# Patient Record
Sex: Male | Born: 1945 | Race: White | Hispanic: No | Smoking: Former smoker
Health system: Southern US, Community
[De-identification: ages and names within clinical notes are randomized; demographics above are authoritative.]

## PROBLEM LIST (undated history)

## (undated) DIAGNOSIS — I5043 Acute on chronic combined systolic (congestive) and diastolic (congestive) heart failure: Secondary | ICD-10-CM

## (undated) DIAGNOSIS — M1990 Unspecified osteoarthritis, unspecified site: Secondary | ICD-10-CM

## (undated) DIAGNOSIS — I10 Essential (primary) hypertension: Secondary | ICD-10-CM

## (undated) DIAGNOSIS — I4891 Unspecified atrial fibrillation: Secondary | ICD-10-CM

---
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
## 2019-04-07 DIAGNOSIS — I509 Heart failure, unspecified: Secondary | ICD-10-CM

## 2019-04-07 DIAGNOSIS — J811 Chronic pulmonary edema: Secondary | ICD-10-CM

## 2019-04-07 DIAGNOSIS — I4891 Unspecified atrial fibrillation: Secondary | ICD-10-CM

## 2019-04-07 DIAGNOSIS — I361 Nonrheumatic tricuspid (valve) insufficiency: Secondary | ICD-10-CM

## 2019-04-07 DIAGNOSIS — J90 Pleural effusion, not elsewhere classified: Secondary | ICD-10-CM

## 2019-04-07 DIAGNOSIS — I10 Essential (primary) hypertension: Secondary | ICD-10-CM

## 2019-04-07 DIAGNOSIS — I340 Nonrheumatic mitral (valve) insufficiency: Secondary | ICD-10-CM

## 2019-04-08 DIAGNOSIS — I4891 Unspecified atrial fibrillation: Secondary | ICD-10-CM | POA: Diagnosis not present

## 2019-04-08 DIAGNOSIS — J811 Chronic pulmonary edema: Secondary | ICD-10-CM | POA: Diagnosis not present

## 2019-04-08 DIAGNOSIS — I509 Heart failure, unspecified: Secondary | ICD-10-CM | POA: Diagnosis not present

## 2019-04-08 DIAGNOSIS — I10 Essential (primary) hypertension: Secondary | ICD-10-CM | POA: Diagnosis not present

## 2019-04-08 DIAGNOSIS — J90 Pleural effusion, not elsewhere classified: Secondary | ICD-10-CM | POA: Diagnosis not present

## 2019-04-09 DIAGNOSIS — J811 Chronic pulmonary edema: Secondary | ICD-10-CM | POA: Diagnosis not present

## 2019-04-09 DIAGNOSIS — I10 Essential (primary) hypertension: Secondary | ICD-10-CM | POA: Diagnosis not present

## 2019-04-09 DIAGNOSIS — J90 Pleural effusion, not elsewhere classified: Secondary | ICD-10-CM | POA: Diagnosis not present

## 2019-04-09 DIAGNOSIS — I4891 Unspecified atrial fibrillation: Secondary | ICD-10-CM | POA: Diagnosis not present

## 2019-04-10 DIAGNOSIS — J811 Chronic pulmonary edema: Secondary | ICD-10-CM | POA: Diagnosis not present

## 2019-04-10 DIAGNOSIS — I4891 Unspecified atrial fibrillation: Secondary | ICD-10-CM | POA: Diagnosis not present

## 2019-04-10 DIAGNOSIS — J90 Pleural effusion, not elsewhere classified: Secondary | ICD-10-CM | POA: Diagnosis not present

## 2019-04-10 DIAGNOSIS — I10 Essential (primary) hypertension: Secondary | ICD-10-CM | POA: Diagnosis not present

## 2019-04-11 ENCOUNTER — Encounter (HOSPITAL_COMMUNITY): Payer: Self-pay

## 2019-04-11 ENCOUNTER — Inpatient Hospital Stay (HOSPITAL_COMMUNITY): Payer: Medicare Other

## 2019-04-11 ENCOUNTER — Inpatient Hospital Stay (HOSPITAL_COMMUNITY)
Admission: AD | Admit: 2019-04-11 | Discharge: 2019-04-14 | DRG: 286 | Disposition: A | Discharge status: Home / Self Care | Payer: Medicare Other | Source: Other Acute Inpatient Hospital | Attending: Internal Medicine | Admitting: Internal Medicine

## 2019-04-11 ENCOUNTER — Other Ambulatory Visit: Payer: Self-pay

## 2019-04-11 DIAGNOSIS — I5043 Acute on chronic combined systolic (congestive) and diastolic (congestive) heart failure: Secondary | ICD-10-CM

## 2019-04-11 DIAGNOSIS — R918 Other nonspecific abnormal finding of lung field: Secondary | ICD-10-CM

## 2019-04-11 DIAGNOSIS — R9439 Abnormal result of other cardiovascular function study: Secondary | ICD-10-CM | POA: Diagnosis not present

## 2019-04-11 DIAGNOSIS — R079 Chest pain, unspecified: Secondary | ICD-10-CM | POA: Diagnosis not present

## 2019-04-11 DIAGNOSIS — Z87891 Personal history of nicotine dependence: Secondary | ICD-10-CM | POA: Diagnosis not present

## 2019-04-11 DIAGNOSIS — M1990 Unspecified osteoarthritis, unspecified site: Secondary | ICD-10-CM | POA: Diagnosis present

## 2019-04-11 DIAGNOSIS — J90 Pleural effusion, not elsewhere classified: Secondary | ICD-10-CM | POA: Diagnosis not present

## 2019-04-11 DIAGNOSIS — R0600 Dyspnea, unspecified: Secondary | ICD-10-CM

## 2019-04-11 DIAGNOSIS — I10 Essential (primary) hypertension: Secondary | ICD-10-CM

## 2019-04-11 DIAGNOSIS — I4891 Unspecified atrial fibrillation: Secondary | ICD-10-CM | POA: Diagnosis present

## 2019-04-11 DIAGNOSIS — I429 Cardiomyopathy, unspecified: Secondary | ICD-10-CM | POA: Diagnosis present

## 2019-04-11 DIAGNOSIS — I2510 Atherosclerotic heart disease of native coronary artery without angina pectoris: Secondary | ICD-10-CM | POA: Diagnosis present

## 2019-04-11 DIAGNOSIS — R7989 Other specified abnormal findings of blood chemistry: Secondary | ICD-10-CM | POA: Diagnosis not present

## 2019-04-11 DIAGNOSIS — I110 Hypertensive heart disease with heart failure: Secondary | ICD-10-CM | POA: Diagnosis present

## 2019-04-11 DIAGNOSIS — I25118 Atherosclerotic heart disease of native coronary artery with other forms of angina pectoris: Secondary | ICD-10-CM | POA: Diagnosis not present

## 2019-04-11 DIAGNOSIS — R778 Other specified abnormalities of plasma proteins: Secondary | ICD-10-CM

## 2019-04-11 DIAGNOSIS — J811 Chronic pulmonary edema: Secondary | ICD-10-CM | POA: Diagnosis not present

## 2019-04-11 DIAGNOSIS — I340 Nonrheumatic mitral (valve) insufficiency: Secondary | ICD-10-CM | POA: Diagnosis not present

## 2019-04-11 HISTORY — DX: Acute on chronic combined systolic (congestive) and diastolic (congestive) heart failure: I50.43

## 2019-04-11 HISTORY — DX: Unspecified osteoarthritis, unspecified site: M19.90

## 2019-04-11 HISTORY — DX: Unspecified atrial fibrillation: I48.91

## 2019-04-11 HISTORY — DX: Essential (primary) hypertension: I10

## 2019-04-11 LAB — CBC WITH DIFFERENTIAL/PLATELET
Abs Immature Granulocytes: 0.02 10*3/uL (ref 0.00–0.07)
Basophils Absolute: 0.1 10*3/uL (ref 0.0–0.1)
Basophils Relative: 1 %
Eosinophils Absolute: 0.1 10*3/uL (ref 0.0–0.5)
Eosinophils Relative: 1 %
HCT: 38.4 % — ABNORMAL LOW (ref 39.0–52.0)
Hemoglobin: 13 g/dL (ref 13.0–17.0)
Immature Granulocytes: 0 %
Lymphocytes Relative: 22 %
Lymphs Abs: 1.4 10*3/uL (ref 0.7–4.0)
MCH: 29.5 pg (ref 26.0–34.0)
MCHC: 33.9 g/dL (ref 30.0–36.0)
MCV: 87.1 fL (ref 80.0–100.0)
Monocytes Absolute: 0.7 10*3/uL (ref 0.1–1.0)
Monocytes Relative: 11 %
Neutro Abs: 4 10*3/uL (ref 1.7–7.7)
Neutrophils Relative %: 65 %
Platelets: 175 10*3/uL (ref 150–400)
RBC: 4.41 MIL/uL (ref 4.22–5.81)
RDW: 13.3 % (ref 11.5–15.5)
WBC: 6.2 10*3/uL (ref 4.0–10.5)
nRBC: 0 % (ref 0.0–0.2)

## 2019-04-11 LAB — COMPREHENSIVE METABOLIC PANEL
ALT: 31 U/L (ref 0–44)
AST: 31 U/L (ref 15–41)
Albumin: 3.8 g/dL (ref 3.5–5.0)
Alkaline Phosphatase: 57 U/L (ref 38–126)
Anion gap: 9 (ref 5–15)
BUN: 27 mg/dL — ABNORMAL HIGH (ref 8–23)
CO2: 25 mmol/L (ref 22–32)
Calcium: 9.2 mg/dL (ref 8.9–10.3)
Chloride: 104 mmol/L (ref 98–111)
Creatinine, Ser: 1.08 mg/dL (ref 0.61–1.24)
GFR calc Af Amer: >60 mL/min (ref >60)
GFR calc non Af Amer: >60 mL/min (ref >60)
Glucose, Bld: 112 mg/dL — ABNORMAL HIGH (ref 70–99)
Potassium: 3.5 mmol/L (ref 3.5–5.1)
Sodium: 138 mmol/L (ref 135–145)
Total Bilirubin: 1.1 mg/dL (ref 0.3–1.2)
Total Protein: 6.9 g/dL (ref 6.5–8.1)

## 2019-04-11 LAB — T4, FREE: Free T4: 0.81 ng/dL (ref 0.61–1.12)

## 2019-04-11 LAB — TSH: TSH: 3.731 u[IU]/mL (ref 0.350–4.500)

## 2019-04-11 LAB — PROTIME-INR
INR: 1.2 (ref 0.8–1.2)
Prothrombin Time: 15.4 seconds — ABNORMAL HIGH (ref 11.4–15.2)

## 2019-04-11 LAB — MAGNESIUM: Magnesium: 2.5 mg/dL — ABNORMAL HIGH (ref 1.7–2.4)

## 2019-04-11 LAB — APTT: aPTT: 69 seconds — ABNORMAL HIGH (ref 24–36)

## 2019-04-11 LAB — HEPARIN LEVEL (UNFRACTIONATED): Heparin Unfractionated: 0.18 IU/mL — ABNORMAL LOW (ref 0.30–0.70)

## 2019-04-11 LAB — TROPONIN I (HIGH SENSITIVITY): Troponin I (High Sensitivity): 21 ng/L — ABNORMAL HIGH (ref <18)

## 2019-04-11 MED ORDER — ASPIRIN EC 81 MG PO TBEC
81.0000 mg | DELAYED_RELEASE_TABLET | Freq: Every day | ORAL | Status: DC
  Administered 2019-04-13 – 2019-04-14 (×2): 81 mg via ORAL
  Filled 2019-04-11 (×2): qty 1

## 2019-04-11 MED ORDER — AMIODARONE HCL IN DEXTROSE 360-4.14 MG/200ML-% IV SOLN
30.0000 mg/h | INTRAVENOUS | Status: DC
  Administered 2019-04-12 – 2019-04-14 (×3): 30 mg/h via INTRAVENOUS
  Filled 2019-04-11 (×7): qty 200

## 2019-04-11 MED ORDER — SODIUM CHLORIDE 0.9% FLUSH
3.0000 mL | Freq: Two times a day (BID) | INTRAVENOUS | Status: DC
  Administered 2019-04-14: 3 mL via INTRAVENOUS

## 2019-04-11 MED ORDER — ONDANSETRON HCL 4 MG/2ML IJ SOLN: 4.0000 mg | Freq: Four times a day (QID) | INTRAMUSCULAR | Status: DC | PRN

## 2019-04-11 MED ORDER — METOPROLOL TARTRATE 5 MG/5ML IV SOLN
INTRAVENOUS | Status: AC
  Administered 2019-04-11: 5 mg via INTRAVENOUS
  Filled 2019-04-11: qty 5

## 2019-04-11 MED ORDER — ASPIRIN 81 MG PO CHEW
81.0000 mg | CHEWABLE_TABLET | ORAL | Status: AC
  Administered 2019-04-12: 81 mg via ORAL
  Filled 2019-04-11: qty 1

## 2019-04-11 MED ORDER — HEPARIN (PORCINE) 25000 UT/250ML-% IV SOLN
1800.0000 [IU]/h | INTRAVENOUS | Status: DC
  Administered 2019-04-11: 1550 [IU]/h via INTRAVENOUS
  Administered 2019-04-12: 1800 [IU]/h via INTRAVENOUS
  Filled 2019-04-11 (×2): qty 250

## 2019-04-11 MED ORDER — FUROSEMIDE 10 MG/ML IJ SOLN: 40.0000 mg | Freq: Two times a day (BID) | INTRAMUSCULAR | Status: DC

## 2019-04-11 MED ORDER — SPIRONOLACTONE 12.5 MG HALF TABLET
12.5000 mg | ORAL_TABLET | Freq: Every day | ORAL | Status: DC
  Administered 2019-04-12 – 2019-04-14 (×3): 12.5 mg via ORAL
  Filled 2019-04-11 (×3): qty 1

## 2019-04-11 MED ORDER — AMIODARONE HCL IN DEXTROSE 360-4.14 MG/200ML-% IV SOLN
60.0000 mg/h | INTRAVENOUS | Status: AC
  Filled 2019-04-11: qty 200

## 2019-04-11 MED ORDER — ATORVASTATIN CALCIUM 80 MG PO TABS
80.0000 mg | ORAL_TABLET | Freq: Every day | ORAL | Status: DC
  Administered 2019-04-11 – 2019-04-13 (×3): 80 mg via ORAL
  Filled 2019-04-11 (×3): qty 1

## 2019-04-11 MED ORDER — METOPROLOL TARTRATE 100 MG PO TABS
100.0000 mg | ORAL_TABLET | Freq: Two times a day (BID) | ORAL | Status: DC
  Administered 2019-04-11 – 2019-04-14 (×6): 100 mg via ORAL
  Filled 2019-04-11 (×6): qty 1

## 2019-04-11 MED ORDER — METOPROLOL TARTRATE 5 MG/5ML IV SOLN
5.0000 mg | Freq: Once | INTRAVENOUS | Status: AC
  Administered 2019-04-11: 17:00:00 5 mg via INTRAVENOUS

## 2019-04-11 MED ORDER — SODIUM CHLORIDE 0.9% FLUSH: 3.0000 mL | INTRAVENOUS | Status: DC | PRN

## 2019-04-11 MED ORDER — SODIUM CHLORIDE 0.9 % IV SOLN
INTRAVENOUS | Status: DC
  Administered 2019-04-12: 10 mL/h via INTRAVENOUS

## 2019-04-11 MED ORDER — ACETAMINOPHEN 325 MG PO TABS: 650.0000 mg | ORAL_TABLET | ORAL | Status: DC | PRN

## 2019-04-11 MED ORDER — NITROGLYCERIN 0.4 MG SL SUBL: 0.4000 mg | SUBLINGUAL_TABLET | SUBLINGUAL | Status: DC | PRN

## 2019-04-11 MED ORDER — FUROSEMIDE 10 MG/ML IJ SOLN
40.0000 mg | Freq: Once | INTRAMUSCULAR | Status: AC
  Administered 2019-04-11: 40 mg via INTRAVENOUS
  Filled 2019-04-11: qty 4

## 2019-04-11 MED ORDER — AMIODARONE LOAD VIA INFUSION
150.0000 mg | Freq: Once | INTRAVENOUS | Status: AC
  Administered 2019-04-11: 150 mg via INTRAVENOUS
  Filled 2019-04-11: qty 83.34

## 2019-04-11 MED ORDER — SODIUM CHLORIDE 0.9 % IV SOLN: 250.0000 mL | INTRAVENOUS | Status: DC | PRN

## 2019-04-11 MED ORDER — LOSARTAN POTASSIUM 25 MG PO TABS
25.0000 mg | ORAL_TABLET | Freq: Once | ORAL | Status: AC
  Administered 2019-04-11: 25 mg via ORAL
  Filled 2019-04-11: qty 1

## 2019-04-11 MED ORDER — LOSARTAN POTASSIUM 25 MG PO TABS
25.0000 mg | ORAL_TABLET | Freq: Every day | ORAL | Status: DC
  Administered 2019-04-12 – 2019-04-14 (×3): 25 mg via ORAL
  Filled 2019-04-11 (×3): qty 1

## 2019-04-11 NOTE — Progress Notes (Signed)
Pt arrived to 3e 29 via ambulance , pt alert and oriented, heparin at 15.73ml per report and pt was received by Mikle Bosworth RN, pt afib on telemetry and confirmed with CCMD, paged MD to advise pt arrived to floor, pt oriented to unit, bed and call bell

## 2019-04-11 NOTE — Progress Notes (Signed)
Pt hr remaining high, D Dunn PA in to see pt, order for lopressorivx1, pt says he feels fine.

## 2019-04-11 NOTE — Consult Note (Addendum)
Cardiology Consultation:   Patient ID: Dwayne Mendoza; [id number]; [birth year]   Admit date: 04/11/2019 Date of Consult: 04/11/2019  Primary Care Provider: Patient, No Pcp Per Primary Cardiologist: No primary care provider on file. = will need f/u in [facility] Primary Electrophysiologist:  None  Chief Complaint: shortness of breath  Patient Profile:   Dwayne Mendoza is a 73 y.o. male with no significant PMH except former tobacco abuse (15 yrs) who is being seen today for the evaluation of atrial fibrillation, new onset systolic CHF and abnormal stress test at the request of Dr. Tanda Rockers at [facility].  History of Present Illness:   Dwayne Mendoza considers himself fortunate to have had good health all these years and does not traditionally seek medical care. He enjoys [employer] and is on-the-go sun up to sun down. He cuts wood and does yardwork. Last week on Thursday 5/25 he noted worsening SOB. Looking back over the week before he now retrospectively acknowledges generalized weight gain within his clothes, swelling, and an unsettled feeling in his chest about 1-2 days prior. He has not had any recent chest pain or viral prodromes. He presented to [facility] where he was found to be hypertensive and in atrial fib with RVR of uncertain duration. Admit note indicates HR was in the 120s and BP 188/95. He was started on IV heparin, Cardizem drip and IV Lasix. Cardizem was discontinued in lieu of switching to metoprolol and adding lisinopril and spironolactone. His troponin level was mildly elevated at 0.04-0.04-0.05-0.04. CXR 04/07/19 showed possible RLL opacity concerning for atelectasis or infiltrate. CT angio 04/07/19 showed patchy ground glass densities in both lungs, R>L, potentially related to pulmonary edema or multilobar PNA; moderate bilateral pleural effusions, mild reactive mediastinal and hilar lymphadenopathy, no evidence of PE. 2D echo 04/07/19 showed mild LVH, severe  global HK EF 25-30%, severely dilated LA, mildly enlarged RA, mild MR/TR. With diuresis he was able to be titrated off supplemental O2 and has had significant improvement in symptoms. He underwent Lexiscan nuclear stress test today with resting HR 100 and peak HR 155 with 69mm ST depression in the inferolateral leads (although EKG changes with Lexiscan are often nonspecific). Imaging results showed areas of prior infarct in the mid septal wall as well as inferior wall near the base, small area of reversibility is noted in the lateral wall consistent with ischemia, EF 21%, high risk. It was recommended he transfer to [facility] for consideration of cardiac cath. He is feeling much better but still has slight lower extremity edema surrounding his ankles.  Initial labs showed A1C 6.4, pro BNP 1340, albumin 4.4, TBili 1.4, AST/ALT OK, TSH 6.94, free T4 normal, trig 83, LDL 96. LAt BMET 04/11/19 showed Na 138, K 3.7, BUN 29, Cr 0.90, Mg 2.3, Hgb 13.0, Hct 38.1, Covid negative - WBC normal upon arrival.   Past Medical History:  Diagnosis Date   Arthritis     History reviewed. No pertinent surgical history.   Inpatient Medications: Scheduled Meds:  [START ON 04/12/2019] aspirin EC  81 mg Oral Daily   furosemide  40 mg Intravenous Once   [START ON 04/12/2019] losartan  25 mg Oral Daily   metoprolol tartrate  100 mg Oral BID   [START ON 04/12/2019] spironolactone  12.5 mg Oral Daily   Continuous Infusions:  heparin     PRN Meds: acetaminophen, nitroGLYCERIN, ondansetron (ZOFRAN) IV  Home Meds: Prior to Admission medications   Medication Sig Start Date End Date Taking?  Authorizing Provider  ibuprofen (ADVIL) 200 MG tablet Take 200 mg by mouth every 8 (eight) hours as needed for headache or moderate pain.   Yes [provider]    Allergies:   No Known Allergies  Social History:   Social History   Socioeconomic History   Marital status: Widowed    Spouse name: Not on  file   Number of children: Not on file   Years of education: Not on file   Highest education level: Not on file  Occupational History   Not on file  Social Needs   Financial resource strain: Not on file   Food insecurity    Worry: Not on file    Inability: Not on file   Transportation needs    Medical: Not on file    Non-medical: Not on file  Tobacco Use   Smoking status: Former Smoker    Types: Cigarettes    Quit date: 04/11/1979    Years since quitting: 40.0   Smokeless tobacco: Never Used  Substance and Sexual Activity   Alcohol use: Never    Frequency: Never   Drug use: Never   Sexual activity: Not on file  Lifestyle   Physical activity    Days per week: Not on file    Minutes per session: Not on file   Stress: Not on file  Relationships   Social connections    Talks on phone: Not on file    Gets together: Not on file    Attends religious service: Not on file    Active member of club or organization: Not on file    Attends meetings of clubs or organizations: Not on file    Relationship status: Not on file   Intimate partner violence    Fear of current or ex partner: Not on file    Emotionally abused: Not on file    Physically abused: Not on file    Forced sexual activity: Not on file  Other Topics Concern   Not on file  Social History Narrative   Not on file    Family History:   The patient's family history includes Other in his father and mother.  Father died age 78 unclear cause but occupational exposures? Mother died in her 46s, possibly heart  ROS:  Please see the history of present illness.  All other ROS reviewed and negative.     Physical Exam/Data:   Vitals:   04/11/19 1444 04/11/19 1637 04/11/19 1649  BP: 115/87 (!) 135/99 (!) 140/98  Pulse: (!) 106    Resp: 20    Temp: 98.4 F (36.9 C)    TempSrc: Oral    SpO2: 99%    Weight: 111 kg    Height: 5\' 10"  (1.778 m)     No intake or output data in the 24 hours ending  04/11/19 1717 Last 3 Weights 04/11/2019  Weight (lbs) 244 lb 11.2 oz  Weight (kg) 110.995 kg    Body mass index is 35.11 kg/m.  General: Well developed, well nourished cheerful WM in no acute distress. Head: Normocephalic, atraumatic, sclera non-icteric, no xanthomas, nares are without discharge.  Neck: Negative for carotid bruits. JVD not elevated. Lungs: Clear bilaterally to auscultation without wheezes, rales, or rhonchi. Breathing is unlabored. Heart: Irregularly irregular, tachycardic, with S1 S2. No murmurs, rubs, or gallops appreciated. Abdomen: Soft, non-tender, non-distended with normoactive bowel sounds. No hepatomegaly. No rebound/guarding. No obvious abdominal masses. Msk:  Strength and tone appear normal for age. Extremities:  No clubbing or cyanosis. Trace BL ankle edema (stocky).  Distal pedal pulses are 2+ and equal bilaterally. Neuro: Alert and oriented X 3. No facial asymmetry. No focal deficit. Moves all extremities spontaneously. Psych:  Responds to questions appropriately with a normal affect.  EKG:  The EKG was personally reviewed and demonstrates atrial fib 136bpm, possible LVH, nonspecific STT changes diffusely. No prior to compare to.   Laboratory Data: Referenced above  Radiology/Studies:  No results found.  Assessment and Plan:   1. Atrial fib with RVR of unknown duration - continue heparin per pharmacy. 5mg  IV Lopressor given on floor due to HR 150s. Increase oral metoprolol to 100mg  BID. Pending results of cath, will need to think about pursuing DCCV (+/- TEE during admission or after 3 weeks of uninterrupted anticoagulation). Dr. Delton SeeNelson recommends to start IV amiodarone given severe LA enlargement and LV dysfunction as there is concern patient have difficulty maintaining NSR even if cardioverted. Diltiazem is being avoided given his LV dysfunction.  2. Abnormal stress test with acute systolic CHF/cardiomyopathy of undetermined etiology - stress test raises  question of ischemia so will plan R/LHC to definitively eval in AM. Risks and benefits of cardiac catheterization have been discussed with the patient. These include bleeding, infection, kidney damage, stroke, heart attack, death. The patient understands these risks and is willing to proceed. He's on for 10:30 tomorrow with Dr. Allyson SabalBerry per preliminary discussion with cath lab. Increase metoprolol as above with plans to eventually consolidate to Toprol. Continue IV Lasix through one more dose this PM then hold further doses. Continue spironolactone. Change lisinopril to losartan in case we decide to start Entresto this admission following cath. Add statin if CAD is present. Given AF RVR there is a chance that cardiomyopathy could be tachy-mediated instead. Denies ETOH/drug use.  3. Essential HTN - follow BP with above medication regimen.  4. Abnormal TSH - will need OP f/u for this. IM has ordered repeat this admission.   For questions or updates, please contact [facility] Please consult [email or web address] for contact info under Cardiology/STEMI.   Signed, Laurann Montanaayna N Dunn, PA-C  04/11/2019 5:17 PM   The patient was seen, examined and discussed with Ronie Spiesayna Dunn, PA-C and I agree with the above.   Physical exam reveals no JVDs, S1-S2 regular rhythm, 3 out of 6 systolic murmur, minimal crackles bilaterally, mild nonpitting lower extremity edema bilaterally up to mid calves.  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  If he does not cardiovert spontaneously we will plan for TEE with cardioversion on Wednesday.  Increase metoprolol to 100 mg p.o. twice daily, continue aspirin, spironolactone, add atorvastatin  and losartan 25 mg daily.  Tobias AlexanderKatarina Anyelin Mogle, MD

## 2019-04-11 NOTE — Progress Notes (Signed)
ANTICOAGULATION CONSULT NOTE - Initial Consult  Pharmacy Consult for Heparin Indication: atrial fibrillation and abnormal stress test for cards eval  No Known Allergies  Patient Measurements: Height: 5\' 10"  (177.8 cm) Weight: 244 lb 11.2 oz (111 kg) IBW/kg (Calculated) : 73 Heparin Dosing Weight: 97.2 kg  Vital Signs: Temp: 98.4 F (36.9 C) (06/29 1444) Temp Source: Oral (06/29 1444) BP: 140/98 (06/29 1649) Pulse Rate: 106 (06/29 1444)  Labs: Recent Labs    04/11/19 1617  HGB 13.0  HCT 38.4*  PLT 175  APTT 69*  LABPROT 15.4*  INR 1.2    CrCl cannot be calculated (No successful lab value found.).   Medical History: Past Medical History:  Diagnosis Date  . Arthritis     Medications:  Infusions:  . heparin      Assessment: 34 YOM who presented to [facility] with 1-2 weeks of shortness of breath and found to be in Afib with RVR transferred to [facility] on 6/29 for cards evaluation after an abnormal stress test.. Patient has been on IV Heparin since 6/25 at [place] with a current rate of 1550 units/hr upon transfer. Pharmacy consulted to dose Heparin this admission.   aPTTs were being checked for heparin monitoring at [facility] with a last aPTT of 53 at 0030 at [facility] on a rate of 1550 units/hr (15.5 ml/hr)  A heparin level on admission here at [facility] is SUBtherapeutic (HL 0.18, goal of 0.3-0.7). Hgb 13, plts 175. No bleeding or issues with the drip noted - will increase and recheck a 8h HL.   Goal of Therapy:  Heparin level 0.3-0.7 units/ml Monitor platelets by anticoagulation protocol: Yes   Plan:   - Increase Heparin drip rate to 1800 units/hr (18 ml/hr) - Will continue to monitor for any signs/symptoms of bleeding and will follow up with heparin level in 8 hours   Thank you for allowing pharmacy to be a part of this patient's care.  Alycia Rossetti, PharmD, BCPS Clinical Pharmacist Clinical phone for 04/11/2019: [phone] 04/11/2019 5:14 PM    **Pharmacist phone directory can now be found on [email or web address] ([email or web address]).  Listed under [place].

## 2019-04-11 NOTE — H&P (Signed)
History and Physical:    Dwayne Mendoza   XVQ:[id number] DOB: 09/25/1946 DOA: 04/11/2019  Referring MD/provider: Dr [place], [facility]  PCP: Patient, No Pcp Per   Patient coming from: Home  Chief Complaint: Patient initially presented with shortness of breath found to have new onset A. fib with RVR, pulmonary edema with an STEMI and positive stress test.  Patient is now transferred for cardiac catheterization in the morning.  History of Present Illness:   Dwayne Mendoza is an 73 y.o. male with past medical history significant for tobacco use who was in his usual state of health until last week when he presented to [facility] with worsening dyspnea on exertion and some uncomfortable feelings in his chest.  In the ED patient was noted to be in new onset atrial fibrillation with RVR, newly hypertensive and to have groundglass opacifications in his lungs suggestive of pulmonary edema.  COVID test at that time was negative.  Patient was admitted and responded well to diuresis with significantly improved respiratory status.  He did not quite rule in for an NSTEMI however echocardiogram did show severe global hypokinesis with an EF of 25 to 30%, severely dilated left atrium and a mildly enlarged right atrium.  Patient underwent a Lexiscan nuclear stress test today which was positive for inferior wall ischemia and may be some septal ischemia as well.  Patient is now transferred to [facility] for catheterization in the morning.  Patient states he feels well today.  He feels very fortunate to have gotten the care that he got.  He realizes now that he is really been short of breath for several months but he did not know it.  He notes he feels much better after diuresis.  No chest pain at all.    ROS:   ROS   Review of Systems: General: No fever, chills, weight changes Endocrine: no heat/cold intolerance, no polyuria Respiratory: No cough,, shortness of breath, hemoptysis GI: No  nausea, vomiting, diarrhea, constipation GU: No dysuria, increased frequency CNS: No numbness, dizziness, headache Musculoskeletal: No back pain, joint pain Blood/lymphatics: No easy bruising, bleeding Mood/affect: No anxiety/depression    Past Medical History:   Past Medical History:  Diagnosis Date  . Arthritis     Past Surgical History:   History reviewed. No pertinent surgical history.  Social History:   Social History   Socioeconomic History  . Marital status: Widowed    Spouse name: Not on file  . Number of children: Not on file  . Years of education: Not on file  . Highest education level: Not on file  Occupational History  . Not on file  Social Needs  . Financial resource strain: Not on file  . Food insecurity    Worry: Not on file    Inability: Not on file  . Transportation needs    Medical: Not on file    Non-medical: Not on file  Tobacco Use  . Smoking status: Former Smoker    Types: Cigarettes    Quit date: 04/11/1979    Years since quitting: 40.0  . Smokeless tobacco: Never Used  Substance and Sexual Activity  . Alcohol use: Never    Frequency: Never  . Drug use: Never  . Sexual activity: Not on file  Lifestyle  . Physical activity    Days per week: Not on file    Minutes per session: Not on file  . Stress: Not on file  Relationships  . Social connections  Talks on phone: Not on file    Gets together: Not on file    Attends religious service: Not on file    Active member of club or organization: Not on file    Attends meetings of clubs or organizations: Not on file    Relationship status: Not on file  . Intimate partner violence    Fear of current or ex partner: Not on file    Emotionally abused: Not on file    Physically abused: Not on file    Forced sexual activity: Not on file  Other Topics Concern  . Not on file  Social History Narrative  . Not on file    Allergies   Patient has no known allergies.  Family history:    Family History  Problem Relation Age of Onset  . Other Father        Patient unsure what patient died of but had a lot of occupational exposures - died age 75  . Other Mother        Patient unsure, maybe heart - died in her 3690s    Current Medications:   Prior to Admission medications   Medication Sig Start Date End Date Taking? Authorizing Provider  ibuprofen (ADVIL) 200 MG tablet Take 400 mg by mouth every 8 (eight) hours as needed for headache (arthritis pain).    Yes [provider]    Physical Exam:   Vitals:   04/11/19 1444 04/11/19 1637 04/11/19 1649 04/11/19 1751  BP: 115/87 (!) 135/99 (!) 140/98 139/88  Pulse: (!) 106   100  Resp: 20     Temp: 98.4 F (36.9 C)   98.5 F (36.9 C)  TempSrc: Oral   Oral  SpO2: 99%     Weight: 111 kg     Height: 5\' 10"  (1.778 m)        Physical Exam: Blood pressure 139/88, pulse 100, temperature 98.5 F (36.9 C), temperature source Oral, resp. rate 20, height 5\' 10"  (1.778 m), weight 111 kg, SpO2 99 %. Gen: Friendly gentleman with centripetal obesity lying in bed at 30 degrees in no acute respiratory distress.  Speaking in full sentences without difficulty Eyes: Sclerae anicteric. Conjunctiva mildly injected. Chest: Moderately good air entry bilaterally with no adventitious sounds.  CV: Distant, irregular, systolic murmur without radiation. Abdomen: NABS, soft, nondistended, nontender. No tenderness to light or deep palpation. No rebound, no guarding. Extremities: No edema.  Skin: Warm and dry. No rashes, lesions or wounds. Neuro: Alert and oriented times 3; grossly nonfocal. Psych: Patient is cooperative, logical and coherent with appropriate mood and affect.  Data Review:    Labs: Basic Metabolic Panel: Recent Labs  Lab 04/11/19 1617  NA 138  K 3.5  CL 104  CO2 25  GLUCOSE 112*  BUN 27*  CREATININE 1.08  CALCIUM 9.2  MG 2.5*   Liver Function Tests: Recent Labs  Lab 04/11/19 1617  AST 31  ALT 31   ALKPHOS 57  BILITOT 1.1  PROT 6.9  ALBUMIN 3.8   No results for input(s): LIPASE, AMYLASE in the last 168 hours. No results for input(s): AMMONIA in the last 168 hours. CBC: Recent Labs  Lab 04/11/19 1617  WBC 6.2  NEUTROABS 4.0  HGB 13.0  HCT 38.4*  MCV 87.1  PLT 175   Cardiac Enzymes: No results for input(s): CKTOTAL, CKMB, CKMBINDEX, TROPONINI in the last 168 hours.  BNP (last 3 results) No results for input(s): PROBNP in the last 8760 hours.  CBG: No results for input(s): GLUCAP in the last 168 hours.  Urinalysis No results found for: COLORURINE, APPEARANCEUR, LABSPEC, PHURINE, GLUCOSEU, HGBUR, BILIRUBINUR, KETONESUR, PROTEINUR, UROBILINOGEN, NITRITE, LEUKOCYTESUR    Radiographic Studies: Dg Chest 1 View  Result Date: 04/11/2019 CLINICAL DATA:  73 year old male with shortness of breath. EXAM: CHEST  1 VIEW COMPARISON:  None. FINDINGS: Portable AP semi upright view at 1726 hours. Borderline to mild cardiomegaly. Other mediastinal contours are within normal limits. Visualized tracheal air column is within normal limits. Allowing for portable technique the lungs are clear. No pneumothorax or pleural effusion. Negative visible bowel gas pattern. Advanced degenerative changes at the left shoulder. IMPRESSION: No acute cardiopulmonary abnormality. Electronically Signed   By: Genevie Ann M.D.   On: 04/11/2019 18:19    EKG: Has not yet been done even though it is been ordered.  Report from [facility] shows no acute abnormalities and EKG other than rapid heart rate.   Assessment/Plan:   Principal Problem:   Atrial fibrillation with RVR (HCC) Active Problems:   Acute on chronic combined systolic and diastolic CHF (congestive heart failure) (HCC)   Elevated troponin   Essential hypertension   Ground glass opacity present on imaging of lung  73 year old man transferred from [place] for cardiac catheterization in the morning for positive stress test.  He is also noted to  have new onset atrial fibrillation with RVR and echocardiogram which shows global hypokinesis with an EF of 25%.  No evidence of acute infarct in the past week.  CAD Follow troponins, repeat EKG Continue aspirin, metoprolol, losartan and atorvastatin per cardiology Denies chest pain at present.  ATRIAL FIBRILLATION WITH RVR Heart rate was not adequately controlled on metoprolol, amiodarone drip started per cardiology Heart rate is now better controlled Continue heparin drip for both atrial fibrillation and CAD  SYSTOLIC HEART FAILURE Patient's pulmonary edema is much improved with diuresis with Lasix 60 IV twice daily Lungs are clear and he has no further peripheral edema. Continue losartan, spironolactone and metoprolol Ongoing Lasix dosage will need to be titrated depending upon much fluid he builds up daily Follow daily weights and I's and O's closely.  Other information:   DVT prophylaxis: On full dose heparin drip  code Status: Full code. Family Communication: Patient states no need to call his family Disposition Plan: Home Consults called: Cardiology Admission status: Inpatient  The medical decision making on this patient was of high complexity and the patient is at high risk for clinical deterioration, therefore this is a level 3 visit.    Dewaine Oats Tublu Chastity Noland [facility]  If 7PM-7AM, please contact night-coverage [email or web address] Password [facility] 04/11/2019, 7:06 PM

## 2019-04-11 NOTE — Progress Notes (Signed)
Pt iv left forearm amiodarone bolus given and shortly after infusion pt c/o tender, slight edema at site and flush with tender, iv removed with tip intact, warm compress applied, unsuccessful iv attempt x2 , consult placed for iv team asap

## 2019-04-12 ENCOUNTER — Other Ambulatory Visit (HOSPITAL_COMMUNITY): Payer: Medicare Other

## 2019-04-12 ENCOUNTER — Encounter (HOSPITAL_COMMUNITY)
Admission: AD | Disposition: A | Discharge status: Home / Self Care | Payer: Self-pay | Source: Other Acute Inpatient Hospital | Attending: Internal Medicine

## 2019-04-12 DIAGNOSIS — M1990 Unspecified osteoarthritis, unspecified site: Secondary | ICD-10-CM

## 2019-04-12 DIAGNOSIS — I2510 Atherosclerotic heart disease of native coronary artery without angina pectoris: Secondary | ICD-10-CM

## 2019-04-12 HISTORY — PX: RIGHT/LEFT HEART CATH AND CORONARY ANGIOGRAPHY: CATH118266

## 2019-04-12 LAB — CBC
HCT: 40.1 % (ref 39.0–52.0)
Hemoglobin: 13.6 g/dL (ref 13.0–17.0)
MCH: 29.5 pg (ref 26.0–34.0)
MCHC: 33.9 g/dL (ref 30.0–36.0)
MCV: 87 fL (ref 80.0–100.0)
Platelets: 175 10*3/uL (ref 150–400)
RBC: 4.61 MIL/uL (ref 4.22–5.81)
RDW: 13.3 % (ref 11.5–15.5)
WBC: 6.4 10*3/uL (ref 4.0–10.5)
nRBC: 0 % (ref 0.0–0.2)

## 2019-04-12 LAB — POCT I-STAT EG7
Acid-Base Excess: 1 mmol/L (ref 0.0–2.0)
Bicarbonate: 25.7 mmol/L (ref 20.0–28.0)
Calcium, Ion: 1.23 mmol/L (ref 1.15–1.40)
HCT: 34 % — ABNORMAL LOW (ref 39.0–52.0)
Hemoglobin: 11.6 g/dL — ABNORMAL LOW (ref 13.0–17.0)
O2 Saturation: 63 %
Potassium: 3.5 mmol/L (ref 3.5–5.1)
Sodium: 143 mmol/L (ref 135–145)
TCO2: 27 mmol/L (ref 22–32)
pCO2, Ven: 40.7 mmHg — ABNORMAL LOW (ref 44.0–60.0)
pH, Ven: 7.408 (ref 7.250–7.430)
pO2, Ven: 32 mmHg (ref 32.0–45.0)

## 2019-04-12 LAB — POCT I-STAT 7, (LYTES, BLD GAS, ICA,H+H)
Acid-Base Excess: 2 mmol/L (ref 0.0–2.0)
Bicarbonate: 23.9 mmol/L (ref 20.0–28.0)
Calcium, Ion: 1.24 mmol/L (ref 1.15–1.40)
HCT: 35 % — ABNORMAL LOW (ref 39.0–52.0)
Hemoglobin: 11.9 g/dL — ABNORMAL LOW (ref 13.0–17.0)
O2 Saturation: 97 %
Potassium: 3.7 mmol/L (ref 3.5–5.1)
Sodium: 142 mmol/L (ref 135–145)
TCO2: 25 mmol/L (ref 22–32)
pCO2 arterial: 29.3 mmHg — ABNORMAL LOW (ref 32.0–48.0)
pH, Arterial: 7.52 — ABNORMAL HIGH (ref 7.350–7.450)
pO2, Arterial: 77 mmHg — ABNORMAL LOW (ref 83.0–108.0)

## 2019-04-12 LAB — MAGNESIUM: Magnesium: 2.4 mg/dL (ref 1.7–2.4)

## 2019-04-12 LAB — BASIC METABOLIC PANEL
Anion gap: 11 (ref 5–15)
BUN: 26 mg/dL — ABNORMAL HIGH (ref 8–23)
CO2: 25 mmol/L (ref 22–32)
Calcium: 9.5 mg/dL (ref 8.9–10.3)
Chloride: 105 mmol/L (ref 98–111)
Creatinine, Ser: 1.07 mg/dL (ref 0.61–1.24)
GFR calc Af Amer: >60 mL/min (ref >60)
GFR calc non Af Amer: >60 mL/min (ref >60)
Glucose, Bld: 136 mg/dL — ABNORMAL HIGH (ref 70–99)
Potassium: 3.2 mmol/L — ABNORMAL LOW (ref 3.5–5.1)
Sodium: 141 mmol/L (ref 135–145)

## 2019-04-12 LAB — POCT I-STAT, CHEM 8
BUN: 24 mg/dL — ABNORMAL HIGH (ref 8–23)
Calcium, Ion: 1.24 mmol/L (ref 1.15–1.40)
Chloride: 106 mmol/L (ref 98–111)
Creatinine, Ser: 1 mg/dL (ref 0.61–1.24)
Glucose, Bld: 142 mg/dL — ABNORMAL HIGH (ref 70–99)
HCT: 36 % — ABNORMAL LOW (ref 39.0–52.0)
Hemoglobin: 12.2 g/dL — ABNORMAL LOW (ref 13.0–17.0)
Potassium: 3.7 mmol/L (ref 3.5–5.1)
Sodium: 142 mmol/L (ref 135–145)
TCO2: 25 mmol/L (ref 22–32)

## 2019-04-12 LAB — HEPARIN LEVEL (UNFRACTIONATED)
Heparin Unfractionated: 0.45 IU/mL (ref 0.30–0.70)
Heparin Unfractionated: 0.61 IU/mL (ref 0.30–0.70)

## 2019-04-12 LAB — BRAIN NATRIURETIC PEPTIDE: B Natriuretic Peptide: 116.6 pg/mL — ABNORMAL HIGH (ref 0.0–100.0)

## 2019-04-12 SURGERY — RIGHT/LEFT HEART CATH AND CORONARY ANGIOGRAPHY
Anesthesia: LOCAL

## 2019-04-12 MED ORDER — HEPARIN (PORCINE) IN NACL 1000-0.9 UT/500ML-% IV SOLN
INTRAVENOUS | Status: AC
  Filled 2019-04-12: qty 1000

## 2019-04-12 MED ORDER — POTASSIUM CHLORIDE CRYS ER 20 MEQ PO TBCR
40.0000 meq | EXTENDED_RELEASE_TABLET | ORAL | Status: AC
  Administered 2019-04-12 (×2): 40 meq via ORAL
  Filled 2019-04-12 (×2): qty 2

## 2019-04-12 MED ORDER — HEPARIN SODIUM (PORCINE) 1000 UNIT/ML IJ SOLN
INTRAMUSCULAR | Status: AC
  Filled 2019-04-12: qty 1

## 2019-04-12 MED ORDER — LIDOCAINE HCL (PF) 1 % IJ SOLN
INTRAMUSCULAR | Status: AC
  Filled 2019-04-12: qty 30

## 2019-04-12 MED ORDER — LABETALOL HCL 5 MG/ML IV SOLN: 10.0000 mg | INTRAVENOUS | Status: AC | PRN

## 2019-04-12 MED ORDER — SODIUM CHLORIDE 0.9 % IV SOLN: 250.0000 mL | INTRAVENOUS | Status: DC | PRN

## 2019-04-12 MED ORDER — HEPARIN (PORCINE) IN NACL 1000-0.9 UT/500ML-% IV SOLN
INTRAVENOUS | Status: DC | PRN
  Administered 2019-04-12 (×2): 500 mL

## 2019-04-12 MED ORDER — IOHEXOL 350 MG/ML SOLN
INTRAVENOUS | Status: DC | PRN
  Administered 2019-04-12: 50 mL via INTRAVENOUS

## 2019-04-12 MED ORDER — ONDANSETRON HCL 4 MG/2ML IJ SOLN: 4.0000 mg | Freq: Four times a day (QID) | INTRAMUSCULAR | Status: DC | PRN

## 2019-04-12 MED ORDER — HEPARIN (PORCINE) 25000 UT/250ML-% IV SOLN
1650.0000 [IU]/h | INTRAVENOUS | Status: DC
  Administered 2019-04-12: 1800 [IU]/h via INTRAVENOUS
  Filled 2019-04-12 (×3): qty 250

## 2019-04-12 MED ORDER — LIDOCAINE HCL (PF) 1 % IJ SOLN
INTRAMUSCULAR | Status: DC | PRN
  Administered 2019-04-12 (×2): 2 mL

## 2019-04-12 MED ORDER — FENTANYL CITRATE (PF) 100 MCG/2ML IJ SOLN
INTRAMUSCULAR | Status: AC
  Filled 2019-04-12: qty 2

## 2019-04-12 MED ORDER — FENTANYL CITRATE (PF) 100 MCG/2ML IJ SOLN
INTRAMUSCULAR | Status: DC | PRN
  Administered 2019-04-12: 25 ug via INTRAVENOUS

## 2019-04-12 MED ORDER — HEPARIN SODIUM (PORCINE) 1000 UNIT/ML IJ SOLN
INTRAMUSCULAR | Status: DC | PRN
  Administered 2019-04-12: 5500 [IU] via INTRAVENOUS

## 2019-04-12 MED ORDER — VERAPAMIL HCL 2.5 MG/ML IV SOLN
INTRAVENOUS | Status: DC | PRN
  Administered 2019-04-12: 10 mL via INTRA_ARTERIAL

## 2019-04-12 MED ORDER — VERAPAMIL HCL 2.5 MG/ML IV SOLN
INTRAVENOUS | Status: AC
  Filled 2019-04-12: qty 2

## 2019-04-12 MED ORDER — MIDAZOLAM HCL 2 MG/2ML IJ SOLN
INTRAMUSCULAR | Status: AC
  Filled 2019-04-12: qty 2

## 2019-04-12 MED ORDER — SODIUM CHLORIDE 0.9% FLUSH
3.0000 mL | Freq: Two times a day (BID) | INTRAVENOUS | Status: DC
  Administered 2019-04-12 – 2019-04-14 (×3): 3 mL via INTRAVENOUS

## 2019-04-12 MED ORDER — SODIUM CHLORIDE 0.9% FLUSH: 3.0000 mL | INTRAVENOUS | Status: DC | PRN

## 2019-04-12 MED ORDER — MIDAZOLAM HCL 2 MG/2ML IJ SOLN
INTRAMUSCULAR | Status: DC | PRN
  Administered 2019-04-12: 2 mg via INTRAVENOUS

## 2019-04-12 MED ORDER — ACETAMINOPHEN 325 MG PO TABS
650.0000 mg | ORAL_TABLET | ORAL | Status: DC | PRN
  Administered 2019-04-13: 23:00:00 650 mg via ORAL
  Filled 2019-04-12: qty 2

## 2019-04-12 MED ORDER — HYDRALAZINE HCL 20 MG/ML IJ SOLN: 10.0000 mg | INTRAMUSCULAR | Status: AC | PRN

## 2019-04-12 SURGICAL SUPPLY — 11 items

## 2019-04-12 NOTE — Progress Notes (Signed)
Responded to spiritual care consult. Pt was alert and talkative. PT mentioned the great care from staff and his time here. I gave pt  a copy of an AD and gave pt an overview of how to fill it out. PT stated he wanted to take it home with him to talk over with is his family. Pt was thankful for the [facility] [place] visit. I offered spiritual care with words of comfort, prayer, ministry of presence and empathic listening. Chaplain available as needed. Chaplain Fidel Levy  [phone]

## 2019-04-12 NOTE — Progress Notes (Signed)
ANTICOAGULATION CONSULT NOTE - Follow Up Consult  Pharmacy Consult for heparin Indication: atrial fibrillation and abnormal stress test   Labs: Recent Labs    04/11/19 1617 04/12/19 0211  HGB 13.0 13.6  HCT 38.4* 40.1  PLT 175 175  APTT 69*  --   LABPROT 15.4*  --   INR 1.2  --   HEPARINUNFRC 0.18* 0.45  CREATININE 1.08  --   TROPONINIHS 21*  --     Assessment/Plan:  73yo male therapeutic on heparin after rate change. Will continue gtt at current rate and confirm stable with additional level.   Wynona Neat, PharmD, BCPS  04/12/2019,3:31 AM

## 2019-04-12 NOTE — Interval H&P Note (Signed)
Cath Lab Visit (complete for each Cath Lab visit)  Clinical Evaluation Leading to the Procedure:   ACS: Yes.    Non-ACS:    Anginal Classification: CCS IV  Anti-ischemic medical therapy: Minimal Therapy (1 class of medications)  Non-Invasive Test Results: No non-invasive testing performed  Prior CABG: No previous CABG      History and Physical Interval Note:  04/12/2019 2:19 PM  Dwayne Mendoza  has presented today for surgery, with the diagnosis of heart failure.  The various methods of treatment have been discussed with the patient and family. After consideration of risks, benefits and other options for treatment, the patient has consented to  Procedure(s): RIGHT/LEFT HEART CATH AND CORONARY ANGIOGRAPHY (N/A) as a surgical intervention.  The patient's history has been reviewed, patient examined, no change in status, stable for surgery.  I have reviewed the patient's chart and labs.  Questions were answered to the patient's satisfaction.     Larae Grooms

## 2019-04-12 NOTE — Plan of Care (Signed)
  Problem: Activity: Goal: Ability to tolerate increased activity will improve Outcome: Progressing   

## 2019-04-12 NOTE — Progress Notes (Addendum)
ANTICOAGULATION CONSULT NOTE   Pharmacy Consult for Heparin Indication: atrial fibrillation and abnormal stress test for cards eval  No Known Allergies  Patient Measurements: Height: 5\' 10"  (177.8 cm) Weight: 242 lb 11.2 oz (110.1 kg)(scale b) IBW/kg (Calculated) : 73 Heparin Dosing Weight: 97.2 kg  Vital Signs: Temp: 98.4 F (36.9 C) (06/30 0724) Temp Source: Oral (06/30 0724) BP: 142/108 (06/30 0934) Pulse Rate: 87 (06/30 0934)  Labs: Recent Labs    04/11/19 1617 04/12/19 0211 04/12/19 0947  HGB 13.0 13.6  --   HCT 38.4* 40.1  --   PLT 175 175  --   APTT 69*  --   --   LABPROT 15.4*  --   --   INR 1.2  --   --   HEPARINUNFRC 0.18* 0.45 0.61  CREATININE 1.08 1.07  --   TROPONINIHS 21*  --   --     Estimated Creatinine Clearance: 75.2 mL/min (by C-G formula based on SCr of 1.07 mg/dL).   Medical History: Past Medical History:  Diagnosis Date  . Arthritis     Medications:  Infusions:  . sodium chloride    . sodium chloride 10 mL/hr (04/12/19 9323)  . amiodarone 30 mg/hr (04/12/19 0339)  . heparin 1,800 Units/hr (04/12/19 0941)    Assessment: 57 YOM who presented to [facility] with 1-2 weeks of shortness of breath and found to be in Afib with RVR transferred to [facility] on 6/29 for cards evaluation after an abnormal stress test.. Patient has been on IV Heparin since 6/25 at [facility] with a current rate of 1550 units/hr upon transfer. Pharmacy consulted to dose Heparin this admission.   Heparin level this morning came back therapeutic at 0.61, on 1800 units/hr. Hgb 13.6, plt 175. No s/sx of bleeding.   Goal of Therapy:  Heparin level 0.3-0.7 units/ml Monitor platelets by anticoagulation protocol: Yes   Plan:   - Continue heparin drip rate at 1800 units/hr (18 ml/hr) - Monitor daily HL, CBC, and for s/sx of bleeding - F/u after cath  Thank you for allowing pharmacy to be a part of this patient's care.  Antonietta Jewel, PharmD, BCCCP Clinical  Pharmacist  Pager: [phone] Phone: [phone] Clinical phone for 04/12/2019: [id number] 04/12/2019 11:00 AM   **Pharmacist phone directory can now be found on [email or web address] ([email or web address]).  Listed under [place].    ADDENDUM Underwent cardiac cath today - plan to treat as heart failure and if continue to have angina, consider PCI of OM. Plan to restart heparin infusion 8 hours after sheath removal (documented 6/30@1518 ). Will resume at previous therapeutic rate of 1800 units/hr on 6/30@2300  and obtain heparin level with AM labs.    Antonietta Jewel, PharmD, [place] Clinical Pharmacist

## 2019-04-12 NOTE — Progress Notes (Signed)
Pt to cath lab in bed in stable conditon, heparin off per cath lab on call to OR, pt has been NPO except sips with meds, telemetry ccmd called to advise pt to cath lab

## 2019-04-12 NOTE — Progress Notes (Addendum)
Per Dr. Nelson, TEE DCCV able to be scheduled for tomorrow instead of Thursday. She recommends heparin 8 hr post sheath pull with transition to Eliquis after procedure if no post cath bleeding issues. Dr Nelson had already written pre procedure orders. I updated consent order to reflect change to Dr. Skains and also NPO after midnight except sips with meds. Will write care management consult to eval cost for both Entresto and Eliquis. Updated nurse on plan.  Dayna Dunn PA-C  

## 2019-04-12 NOTE — Final Progress Note (Signed)
Pt received from cath lab, right wrist tr band cdi, level 0 13cc air, vss, afib on tele, CCMD called to advise pt back to room, instructed on limiting arm restrictions, freq vital signs, tr band removal procedure, dinner tray ordered for pt, freq of vital signs set, instructed pt can go to bathroom or get in chair but needs assistance, verbalized understanding

## 2019-04-12 NOTE — Progress Notes (Signed)
Progress Note  Patient Name: Dwayne Mendoza Date of Encounter: 04/12/2019  Primary Cardiologist: No primary care provider on file. = will need f/u in [facility] Primary Electrophysiologist:  None  Subjective   He felt dizzy when standing up this morning, no fall.  No chest pain.  Inpatient Medications    Scheduled Meds: . aspirin EC  81 mg Oral Daily  . atorvastatin  80 mg Oral q1800  . losartan  25 mg Oral Daily  . metoprolol tartrate  100 mg Oral BID  . potassium chloride  40 mEq Oral Q4H  . sodium chloride flush  3 mL Intravenous Q12H  . spironolactone  12.5 mg Oral Daily   Continuous Infusions: . sodium chloride    . sodium chloride 10 mL/hr (04/12/19 5102)  . amiodarone 30 mg/hr (04/12/19 0339)  . heparin 1,550 Units/hr (04/11/19 1749)   PRN Meds: sodium chloride, acetaminophen, nitroGLYCERIN, ondansetron (ZOFRAN) IV, sodium chloride flush   Vital Signs    Vitals:   04/11/19 1936 04/12/19 0042 04/12/19 0534 04/12/19 0724  BP: 119/69 133/82 (!) 137/92 136/78  Pulse: 99 84 84 89  Resp: 18 18 20 18   Temp: 98.3 F (36.8 C) 98.5 F (36.9 C) 98.2 F (36.8 C) 98.4 F (36.9 C)  TempSrc: Oral Oral Oral Oral  SpO2: 95% 97% 98% 98%  Weight:   110.1 kg   Height:        Intake/Output Summary (Last 24 hours) at 04/12/2019 0926 Last data filed at 04/12/2019 0750 Gross per 24 hour  Intake 1257.13 ml  Output 1495 ml  Net -237.87 ml   Last 3 Weights 04/12/2019 04/11/2019  Weight (lbs) 242 lb 11.2 oz 244 lb 11.2 oz  Weight (kg) 110.088 kg 110.995 kg      Telemetry    Atrial fibrillation with RVR up to 150 bpm- Personally Reviewed  ECG    No new tracing- Personally Reviewed  Physical Exam   GEN: No acute distress.   Neck: No JVD Cardiac: iRRR, no murmurs, rubs, or gallops.  Respiratory:  Kolls at bases bilaterally clear to auscultation bilaterally.  3 out of 6 systolic murmur GI: Soft, nontender, non-distended  MS:  Mild bilateral edema; No deformity.  Neuro:  Nonfocal  Psych: Normal affect   Labs    High Sensitivity Troponin:   Recent Labs  Lab 04/11/19 1617  TROPONINIHS 21*      Cardiac EnzymesNo results for input(s): TROPONINI in the last 168 hours. No results for input(s): TROPIPOC in the last 168 hours.   Chemistry Recent Labs  Lab 04/11/19 1617 04/12/19 0211  NA 138 141  K 3.5 3.2*  CL 104 105  CO2 25 25  GLUCOSE 112* 136*  BUN 27* 26*  CREATININE 1.08 1.07  CALCIUM 9.2 9.5  PROT 6.9  --   ALBUMIN 3.8  --   AST 31  --   ALT 31  --   ALKPHOS 57  --   BILITOT 1.1  --   GFRNONAA >60 >60  GFRAA >60 >60  ANIONGAP 9 11     Hematology Recent Labs  Lab 04/11/19 1617 04/12/19 0211  WBC 6.2 6.4  RBC 4.41 4.61  HGB 13.0 13.6  HCT 38.4* 40.1  MCV 87.1 87.0  MCH 29.5 29.5  MCHC 33.9 33.9  RDW 13.3 13.3  PLT 175 175    BNP Recent Labs  Lab 04/11/19 1617  BNP 116.6*     DDimer No results for input(s): DDIMER in the  last 168 hours.   Radiology    Dg Chest 1 View  Result Date: 04/11/2019 CLINICAL DATA:  73 year old male with shortness of breath. EXAM: CHEST  1 VIEW COMPARISON:  None. FINDINGS: Portable AP semi upright view at 1726 hours. Borderline to mild cardiomegaly. Other mediastinal contours are within normal limits. Visualized tracheal air column is within normal limits. Allowing for portable technique the lungs are clear. No pneumothorax or pleural effusion. Negative visible bowel gas pattern. Advanced degenerative changes at the left shoulder. IMPRESSION: No acute cardiopulmonary abnormality. Electronically Signed   By: Odessa Fleming M.D.   On: 04/11/2019 18:19   Cardiac Studies     Patient Profile     73 y.o. male with no significant PMH except former tobacco abuse (15 yrs) who is being seen today for the evaluation of atrial fibrillation, new onset systolic CHF and abnormal stress test at the request of Dr. Geroge Baseman at [facility].  Assessment & Plan    Acute combined systolic diastolic  CHF New cardiomyopathy of unknown etiology New atrial fibrillation with RVR High risk stress test  Minimal diuresis overnight, he is scheduled for left and right cardiac cath today, will optimize diuretic therapy afterwards, for now we will continue IV heparin and IV amiodarone, Lasix held for cardiac catheterization.   If he does not cardiovert spontaneously we will plan for TEE with cardioversion on Thursday  (no availability on Wednesday).    Continue metoprolol to 100 mg p.o. twice daily, continue aspirin, spironolactone, add atorvastatin and losartan 25 mg daily.  For questions or updates, please contact [facility] Please consult [email or web address] for contact info under     Signed, Tobias Alexander, MD  04/12/2019, 9:26 AM

## 2019-04-12 NOTE — H&P (View-Only) (Signed)
Per Dr. Meda Coffee, TEE DCCV able to be scheduled for tomorrow instead of Thursday. She recommends heparin 8 hr post sheath pull with transition to Eliquis after procedure if no post cath bleeding issues. Dr Meda Coffee had already written pre procedure orders. I updated consent order to reflect change to Dr. Marlou Porch and also NPO after midnight except sips with meds. Will write care management consult to eval cost for both Entresto and Eliquis. Updated nurse on plan.  Dayna Dunn PA-C

## 2019-04-12 NOTE — H&P (View-Only) (Signed)
Progress Note  Patient Name: Dwayne Mendoza Date of Encounter: 04/12/2019  Primary Cardiologist: No primary care provider on file. = will need f/u in [facility] Primary Electrophysiologist:  None  Subjective   He felt dizzy when standing up this morning, no fall.  No chest pain.  Inpatient Medications    Scheduled Meds: . aspirin EC  81 mg Oral Daily  . atorvastatin  80 mg Oral q1800  . losartan  25 mg Oral Daily  . metoprolol tartrate  100 mg Oral BID  . potassium chloride  40 mEq Oral Q4H  . sodium chloride flush  3 mL Intravenous Q12H  . spironolactone  12.5 mg Oral Daily   Continuous Infusions: . sodium chloride    . sodium chloride 10 mL/hr (04/12/19 5102)  . amiodarone 30 mg/hr (04/12/19 0339)  . heparin 1,550 Units/hr (04/11/19 1749)   PRN Meds: sodium chloride, acetaminophen, nitroGLYCERIN, ondansetron (ZOFRAN) IV, sodium chloride flush   Vital Signs    Vitals:   04/11/19 1936 04/12/19 0042 04/12/19 0534 04/12/19 0724  BP: 119/69 133/82 (!) 137/92 136/78  Pulse: 99 84 84 89  Resp: 18 18 20 18   Temp: 98.3 F (36.8 C) 98.5 F (36.9 C) 98.2 F (36.8 C) 98.4 F (36.9 C)  TempSrc: Oral Oral Oral Oral  SpO2: 95% 97% 98% 98%  Weight:   110.1 kg   Height:        Intake/Output Summary (Last 24 hours) at 04/12/2019 0926 Last data filed at 04/12/2019 0750 Gross per 24 hour  Intake 1257.13 ml  Output 1495 ml  Net -237.87 ml   Last 3 Weights 04/12/2019 04/11/2019  Weight (lbs) 242 lb 11.2 oz 244 lb 11.2 oz  Weight (kg) 110.088 kg 110.995 kg      Telemetry    Atrial fibrillation with RVR up to 150 bpm- Personally Reviewed  ECG    No new tracing- Personally Reviewed  Physical Exam   GEN: No acute distress.   Neck: No JVD Cardiac: iRRR, no murmurs, rubs, or gallops.  Respiratory:  Kolls at bases bilaterally clear to auscultation bilaterally.  3 out of 6 systolic murmur GI: Soft, nontender, non-distended  MS:  Mild bilateral edema; No deformity.  Neuro:  Nonfocal  Psych: Normal affect   Labs    High Sensitivity Troponin:   Recent Labs  Lab 04/11/19 1617  TROPONINIHS 21*      Cardiac EnzymesNo results for input(s): TROPONINI in the last 168 hours. No results for input(s): TROPIPOC in the last 168 hours.   Chemistry Recent Labs  Lab 04/11/19 1617 04/12/19 0211  NA 138 141  K 3.5 3.2*  CL 104 105  CO2 25 25  GLUCOSE 112* 136*  BUN 27* 26*  CREATININE 1.08 1.07  CALCIUM 9.2 9.5  PROT 6.9  --   ALBUMIN 3.8  --   AST 31  --   ALT 31  --   ALKPHOS 57  --   BILITOT 1.1  --   GFRNONAA >60 >60  GFRAA >60 >60  ANIONGAP 9 11     Hematology Recent Labs  Lab 04/11/19 1617 04/12/19 0211  WBC 6.2 6.4  RBC 4.41 4.61  HGB 13.0 13.6  HCT 38.4* 40.1  MCV 87.1 87.0  MCH 29.5 29.5  MCHC 33.9 33.9  RDW 13.3 13.3  PLT 175 175    BNP Recent Labs  Lab 04/11/19 1617  BNP 116.6*     DDimer No results for input(s): DDIMER in the  last 168 hours.   Radiology    Dg Chest 1 View  Result Date: 04/11/2019 CLINICAL DATA:  73-year-old male with shortness of breath. EXAM: CHEST  1 VIEW COMPARISON:  None. FINDINGS: Portable AP semi upright view at 1726 hours. Borderline to mild cardiomegaly. Other mediastinal contours are within normal limits. Visualized tracheal air column is within normal limits. Allowing for portable technique the lungs are clear. No pneumothorax or pleural effusion. Negative visible bowel gas pattern. Advanced degenerative changes at the left shoulder. IMPRESSION: No acute cardiopulmonary abnormality. Electronically Signed   By: H  Hall M.D.   On: 04/11/2019 18:19   Cardiac Studies     Patient Profile     73 y.o. male with no significant PMH except former tobacco abuse (15 yrs) who is being seen today for the evaluation of atrial fibrillation, new onset systolic CHF and abnormal stress test at the request of Dr. Autria at [facility].  Assessment & Plan    Acute combined systolic diastolic  CHF New cardiomyopathy of unknown etiology New atrial fibrillation with RVR High risk stress test  Minimal diuresis overnight, he is scheduled for left and right cardiac cath today, will optimize diuretic therapy afterwards, for now we will continue IV heparin and IV amiodarone, Lasix held for cardiac catheterization.   If he does not cardiovert spontaneously we will plan for TEE with cardioversion on Thursday  (no availability on Wednesday).    Continue metoprolol to 100 mg p.o. twice daily, continue aspirin, spironolactone, add atorvastatin and losartan 25 mg daily.  For questions or updates, please contact [facility] Please consult [email or web address] for contact info under     Signed, Tashera Montalvo, MD  04/12/2019, 9:26 AM    

## 2019-04-12 NOTE — Progress Notes (Signed)
PROGRESS NOTE  Dwayne Mendoza ZMO:[id number] DOB: May 02, 1946 DOA: 04/11/2019 PCP: Patient, No Pcp Per   LOS: 1 day   Patient is from: Home  Brief Narrative / Interim history: 73 year old male with no PMH except osteoarthritis presented to [facility] with worsening dyspnea on 5/25 and found to have A. fib with RVR, new systolic CHF with EF to 25 to 03% and hypertension.  He had Lexiscan nuclear stress test on 6/29 concerning for inferolateral ischemia and transferred to [facility] for further work-up.  He was diuresed with IV Lasix with significant improvement in his breathing.  Labs at [place] showed A1c of 6.4%, proBNP of 1340, TSH of 6.94 with normal T4, LDL 96.  COVID-19 negative.  CBC without significant finding.  PE was excluded by CTA chest at [facility].  Subjective: No major events overnight of this morning.  Reports significant improvement in his breathing.  Denies chest pain, dyspnea or palpitation.  Never had orthopnea.  Small swelling in his legs.  No GI or GU symptoms.  Denies recent URI or new medication.  Quit smoking long time ago.  Denies drinking alcohol.  Objective: Vitals:   04/12/19 0534 04/12/19 0724 04/12/19 0934 04/12/19 1106  BP: (!) 137/92 136/78 (!) 142/108 127/87  Pulse: 84 89 87 88  Resp: 20 18  19   Temp: 98.2 F (36.8 C) 98.4 F (36.9 C)  98 F (36.7 C)  TempSrc: Oral Oral  Oral  SpO2: 98% 98%  96%  Weight: 110.1 kg     Height:        Intake/Output Summary (Last 24 hours) at 04/12/2019 1159 Last data filed at 04/12/2019 1107 Gross per 24 hour  Intake 1257.13 ml  Output 1545 ml  Net -287.87 ml   Filed Weights   04/11/19 1444 04/12/19 0534  Weight: 111 kg 110.1 kg    Examination:  GENERAL: No acute distress.  Appears well.  HEENT: MMM.  Vision and hearing grossly intact.  NECK: Supple.  No apparent JVD. LUNGS:  No IWOB. Good air movement bilaterally. HEART: IR.  Heart rate in 80s. Heart sounds normal.  ABD: Bowel sounds present. Soft.  Non tender.  MSK/EXT:  Moves all extremities. No apparent deformity.  Trace edema. SKIN: no apparent skin lesion or wound NEURO: Awake, alert and oriented appropriately.  No gross deficit.  PSYCH: Calm. Normal affect.   I have personally reviewed the following labs and images:  Radiology Studies: Dg Chest 1 View  Result Date: 04/11/2019 CLINICAL DATA:  73 year old male with shortness of breath. EXAM: CHEST  1 VIEW COMPARISON:  None. FINDINGS: Portable AP semi upright view at 1726 hours. Borderline to mild cardiomegaly. Other mediastinal contours are within normal limits. Visualized tracheal air column is within normal limits. Allowing for portable technique the lungs are clear. No pneumothorax or pleural effusion. Negative visible bowel gas pattern. Advanced degenerative changes at the left shoulder. IMPRESSION: No acute cardiopulmonary abnormality. Electronically Signed   By: Odessa Fleming M.D.   On: 04/11/2019 18:19    Microbiology: No results found for this or any previous visit (from the past 240 hour(s)).  Sepsis Labs: Invalid input(s): PROCALCITONIN, LACTICIDVEN  Urine analysis: No results found for: COLORURINE, APPEARANCEUR, LABSPEC, PHURINE, GLUCOSEU, HGBUR, BILIRUBINUR, KETONESUR, PROTEINUR, UROBILINOGEN, NITRITE, LEUKOCYTESUR  Anemia Panel: No results for input(s): VITAMINB12, FOLATE, FERRITIN, TIBC, IRON, RETICCTPCT in the last 72 hours.  Thyroid Function Tests: Recent Labs    04/11/19 1617  TSH 3.731  FREET4 0.81    Lipid Profile: No  results for input(s): CHOL, HDL, LDLCALC, TRIG, CHOLHDL, LDLDIRECT in the last 72 hours.  CBG: No results for input(s): GLUCAP in the last 168 hours.  HbA1C: No results for input(s): HGBA1C in the last 72 hours.  BNP (last 3 results): No results for input(s): PROBNP in the last 8760 hours.  Cardiac Enzymes: No results for input(s): CKTOTAL, CKMB, CKMBINDEX, TROPONINI in the last 168 hours.  Coagulation Profile: Recent Labs  Lab  04/11/19 1617  INR 1.2    Liver Function Tests: Recent Labs  Lab 04/11/19 1617  AST 31  ALT 31  ALKPHOS 57  BILITOT 1.1  PROT 6.9  ALBUMIN 3.8   No results for input(s): LIPASE, AMYLASE in the last 168 hours. No results for input(s): AMMONIA in the last 168 hours.  Basic Metabolic Panel: Recent Labs  Lab 04/11/19 1617 04/12/19 0211  NA 138 141  K 3.5 3.2*  CL 104 105  CO2 25 25  GLUCOSE 112* 136*  BUN 27* 26*  CREATININE 1.08 1.07  CALCIUM 9.2 9.5  MG 2.5* 2.4   GFR: Estimated Creatinine Clearance: 75.2 mL/min (by C-G formula based on SCr of 1.07 mg/dL).  CBC: Recent Labs  Lab 04/11/19 1617 04/12/19 0211  WBC 6.2 6.4  NEUTROABS 4.0  --   HGB 13.0 13.6  HCT 38.4* 40.1  MCV 87.1 87.0  PLT 175 175    Procedures:  None  Microbiology: COVID-19 negative  Assessment & Plan: New systolic CHF: 2D echo at [facility] on 6/25 showed mild LVH, severe global HK EF 25-30%, severely dilated LA, mildly enlarged RA, mild MR/TR. patient presented with progressive dyspnea on exertion.  proBNP at [facility] 1340.  Flat troponin at 0.04.  Good response to IV Lasix.  About 1.5 L output since arrival. -Further plan per cardiology-LHC today -Cardiology to resume Lasix after catheterization -On metoprolol, losartan, Aldactone, statin and aspirin -Daily weight, strict intake and output -Closely monitor electrolytes and replenish aggressively -Salt and fluid restriction  New A. Fib: Rate and rhythm controlled with metoprolol and amiodarone gtt -Continue amiodarone gtt, metoprolol and heparin drip  Abnormal nuclear stress test/CAD: No chest pain no anginal symptoms now.  Troponin flat at 0.04.  No acute ischemic finding on EKG. -Metoprolol and heparin as above -Atorvastatin and aspirin -LHC today  Hypertension: Normotensive -Metoprolol and losartan  History of osteoarthritis -PRN Tylenol  DVT prophylaxis: On heparin drip Code Status: Full code Family Communication: Updated  patient's sister Ms. Moore at [phone] per patient's request. Disposition Plan: Remains inpatient for further cardiac evaluation Consultants: Cardiology  Antimicrobials: Anti-infectives (From admission, onward)   None      Sch Meds:  Scheduled Meds: . aspirin EC  81 mg Oral Daily  . atorvastatin  80 mg Oral q1800  . losartan  25 mg Oral Daily  . metoprolol tartrate  100 mg Oral BID  . potassium chloride  40 mEq Oral Q4H  . sodium chloride flush  3 mL Intravenous Q12H  . spironolactone  12.5 mg Oral Daily   Continuous Infusions: . sodium chloride    . sodium chloride 10 mL/hr (04/12/19 0981)  . amiodarone 30 mg/hr (04/12/19 0339)  . heparin 1,800 Units/hr (04/12/19 0941)   PRN Meds:.sodium chloride, acetaminophen, nitroGLYCERIN, ondansetron (ZOFRAN) IV, sodium chloride flush  35 minutes with more than 50% spent in reviewing records, counseling patient and coordinating care.  Kamdyn Colborn T. [place]  If 7PM-7AM, please contact night-coverage [email or web address] Password [email or web address] 04/12/2019, 11:59 AM

## 2019-04-12 NOTE — Plan of Care (Signed)
  Problem: Activity: Goal: Ability to tolerate increased activity will improve Outcome: Progressing   Problem: Cardiac: Goal: Ability to achieve and maintain adequate cardiopulmonary perfusion will improve Outcome: Progressing   

## 2019-04-12 NOTE — Evaluation (Addendum)
Physical Therapy Evaluation Patient Details Name: Dwayne Mendoza MRN: [id number] DOB: January 12, 1946 Today's Date: 04/12/2019   History of Present Illness  73 yo admitted to [facility] 6/25 with SOB with new onset Afib with RVR, CHFand  pulmonary edema 6/29 with acute STEMI and transferred to [facility]. PMHx: arthritis  Clinical Impression  Pt very pleasant and eager to move today. Pt reports being very active all his life after [place]. Currently push mows his yard and is constantly doing projects. Pt with arthritic hip which slows him down at times with pt currently demonstrating decreased activity tolerance and gait speed. HE will benefit from acute therapy to maximize function and independence for return home with pt encouraged to mobilize with nursing and be OOB for all meals.   HR 88 supine with rise to 112 for majority of gait and brief spike to 122 with standing rest returned to 94 SpO2 96-98% on RA throughout    Follow Up Recommendations No PT follow up    Equipment Recommendations  None recommended by PT    Recommendations for Other Services       Precautions / Restrictions Precautions Precautions: None      Mobility  Bed Mobility Overal bed mobility: Independent                Transfers Overall transfer level: Independent                  Ambulation/Gait Ambulation/Gait assistance: Supervision Gait Distance (Feet): 400 Feet Assistive device: IV Pole;None Gait Pattern/deviations: Step-through pattern;Decreased stride length   Gait velocity interpretation: 1.31 - 2.62 ft/sec, indicative of limited community ambulator General Gait Details: pt initially holding IV pole grossly 200' due to tight and stiff hip and reports holding onto items to "loosen up" in the morning. Pt then able to let go of IV pole and walk without assist with slow cautious gait. One standing rest due to HR elevating to 120 with return to 94. assist to control IV pole and  cues for activity modification based on cardiopulmonary function  Stairs            Wheelchair Mobility    Modified Rankin (Stroke Patients Only)       Balance Overall balance assessment: Mild deficits observed, not formally tested                                           Pertinent Vitals/Pain Pain Assessment: No/denies pain    Home Living Family/patient expects to be discharged to:: Private residence Living Arrangements: Alone   Type of Home: House Home Access: Stairs to enter Entrance Stairs-Rails: Left Entrance Stairs-Number of Steps: 4 Home Layout: Two level;Able to live on main level with bedroom/bathroom Home Equipment: Cane - single point Additional Comments: uses cane only when hip is stiff    Prior Function Level of Independence: Independent         Comments: very active, works outside and [employer], no falls     Hand Dominance        Extremity/Trunk Assessment   Upper Extremity Assessment Upper Extremity Assessment: Overall WFL for tasks assessed    Lower Extremity Assessment Lower Extremity Assessment: Overall WFL for tasks assessed    Cervical / Trunk Assessment Cervical / Trunk Assessment: Normal  Communication   Communication: No difficulties  Cognition Arousal/Alertness: Awake/alert Behavior During Therapy:  WFL for tasks assessed/performed Overall Cognitive Status: Within Functional Limits for tasks assessed                                        General Comments      Exercises     Assessment/Plan    PT Assessment Patient needs continued PT services  PT Problem List Decreased mobility;Decreased activity tolerance       PT Treatment Interventions Gait training;Patient/family education;Balance training;Functional mobility training;Therapeutic activities    PT Goals (Current goals can be found in the Care Plan section)  Acute Rehab PT Goals Patient Stated Goal: return home  and work PT Goal Formulation: With patient Time For Goal Achievement: 04/26/19 Potential to Achieve Goals: Good    Frequency Min 3X/week   Barriers to discharge        Co-evaluation               AM-PAC PT "6 Clicks" Mobility  Outcome Measure Help needed turning from your back to your side while in a flat bed without using bedrails?: None Help needed moving from lying on your back to sitting on the side of a flat bed without using bedrails?: None Help needed moving to and from a bed to a chair (including a wheelchair)?: None Help needed standing up from a chair using your arms (e.g., wheelchair or bedside chair)?: None Help needed to walk in hospital room?: A Little Help needed climbing 3-5 steps with a railing? : A Little 6 Click Score: 22    End of Session   Activity Tolerance: Patient tolerated treatment well Patient left: in bed;with call bell/phone within reach Nurse Communication: Mobility status PT Visit Diagnosis: Other abnormalities of gait and mobility (R26.89)    Time: 9937-1696 PT Time Calculation (min) (ACUTE ONLY): 22 min   Charges:   PT Evaluation $PT Eval Moderate Complexity: 1 Mod          Deette Revak Abner Greenspan, PT Acute Rehabilitation Services Pager: [phone] Office: [phone]   Jayshon Dommer B Bellamia Ferch 04/12/2019, 2:07 PM

## 2019-04-13 ENCOUNTER — Inpatient Hospital Stay (HOSPITAL_COMMUNITY): Payer: Medicare Other | Admitting: Anesthesiology

## 2019-04-13 ENCOUNTER — Inpatient Hospital Stay (HOSPITAL_COMMUNITY): Payer: Medicare Other

## 2019-04-13 ENCOUNTER — Encounter (HOSPITAL_COMMUNITY): Payer: Self-pay | Admitting: Interventional Cardiology

## 2019-04-13 ENCOUNTER — Encounter (HOSPITAL_COMMUNITY)
Admission: AD | Disposition: A | Discharge status: Home / Self Care | Payer: Self-pay | Source: Other Acute Inpatient Hospital | Attending: Internal Medicine

## 2019-04-13 DIAGNOSIS — I25118 Atherosclerotic heart disease of native coronary artery with other forms of angina pectoris: Secondary | ICD-10-CM

## 2019-04-13 DIAGNOSIS — I340 Nonrheumatic mitral (valve) insufficiency: Secondary | ICD-10-CM

## 2019-04-13 HISTORY — PX: CARDIOVERSION: SHX1299

## 2019-04-13 HISTORY — PX: TEE WITHOUT CARDIOVERSION: SHX5443

## 2019-04-13 LAB — CBC
HCT: 36.5 % — ABNORMAL LOW (ref 39.0–52.0)
Hemoglobin: 12.3 g/dL — ABNORMAL LOW (ref 13.0–17.0)
MCH: 29.4 pg (ref 26.0–34.0)
MCHC: 33.7 g/dL (ref 30.0–36.0)
MCV: 87.1 fL (ref 80.0–100.0)
Platelets: 169 10*3/uL (ref 150–400)
RBC: 4.19 MIL/uL — ABNORMAL LOW (ref 4.22–5.81)
RDW: 13.5 % (ref 11.5–15.5)
WBC: 6.9 10*3/uL (ref 4.0–10.5)
nRBC: 0 % (ref 0.0–0.2)

## 2019-04-13 LAB — BASIC METABOLIC PANEL
Anion gap: 9 (ref 5–15)
BUN: 22 mg/dL (ref 8–23)
CO2: 23 mmol/L (ref 22–32)
Calcium: 9.5 mg/dL (ref 8.9–10.3)
Chloride: 108 mmol/L (ref 98–111)
Creatinine, Ser: 1.07 mg/dL (ref 0.61–1.24)
GFR calc Af Amer: >60 mL/min (ref >60)
GFR calc non Af Amer: >60 mL/min (ref >60)
Glucose, Bld: 143 mg/dL — ABNORMAL HIGH (ref 70–99)
Potassium: 3.5 mmol/L (ref 3.5–5.1)
Sodium: 140 mmol/L (ref 135–145)

## 2019-04-13 LAB — MAGNESIUM: Magnesium: 2.4 mg/dL (ref 1.7–2.4)

## 2019-04-13 LAB — HEPARIN LEVEL (UNFRACTIONATED): Heparin Unfractionated: 0.43 IU/mL (ref 0.30–0.70)

## 2019-04-13 SURGERY — ECHOCARDIOGRAM, TRANSESOPHAGEAL
Anesthesia: Monitor Anesthesia Care

## 2019-04-13 MED ORDER — SODIUM CHLORIDE 0.9 % IV SOLN
INTRAVENOUS | Status: DC
  Administered 2019-04-13: 11:00:00 via INTRAVENOUS

## 2019-04-13 MED ORDER — SODIUM CHLORIDE 0.9 % IV SOLN
INTRAVENOUS | Status: DC | PRN
  Administered 2019-04-13: 11:00:00 via INTRAVENOUS

## 2019-04-13 MED ORDER — PHENYLEPHRINE HCL (PRESSORS) 10 MG/ML IV SOLN
INTRAVENOUS | Status: DC | PRN
  Administered 2019-04-13: 100 ug via INTRAVENOUS

## 2019-04-13 MED ORDER — SODIUM CHLORIDE 0.9 % IV SOLN: 250.0000 mL | INTRAVENOUS | Status: DC

## 2019-04-13 MED ORDER — BUTAMBEN-TETRACAINE-BENZOCAINE 2-2-14 % EX AERO
INHALATION_SPRAY | CUTANEOUS | Status: DC | PRN
  Administered 2019-04-13: 2 via TOPICAL

## 2019-04-13 MED ORDER — PROPOFOL 500 MG/50ML IV EMUL
INTRAVENOUS | Status: DC | PRN
  Administered 2019-04-13: 100 ug/kg/min via INTRAVENOUS

## 2019-04-13 MED ORDER — SODIUM CHLORIDE 0.9% FLUSH: 3.0000 mL | INTRAVENOUS | Status: DC | PRN

## 2019-04-13 MED ORDER — LIDOCAINE HCL (CARDIAC) PF 100 MG/5ML IV SOSY
PREFILLED_SYRINGE | INTRAVENOUS | Status: DC | PRN
  Administered 2019-04-13: 30 mg via INTRAVENOUS

## 2019-04-13 MED ORDER — SODIUM CHLORIDE 0.9% FLUSH: 3.0000 mL | Freq: Two times a day (BID) | INTRAVENOUS | Status: DC

## 2019-04-13 NOTE — Progress Notes (Signed)
OT Cancellation Note  Patient Details Name: Dwayne Mendoza MRN: [id number] DOB: [birth year]   Cancelled Treatment:    Reason Eval/Treat Not Completed: OT screened, no needs identified, will sign off. Per PT pt at baseline, OT signing off.  If further needs arise, please re-consult.  Delight Stare, OT Acute Rehabilitation Services Pager [phone] Office [phone]   Delight Stare 04/13/2019, 7:40 AM

## 2019-04-13 NOTE — Progress Notes (Signed)
  Echocardiogram Echocardiogram Transesophageal has been performed.  Burnett Kanaris 04/13/2019, 12:22 PM

## 2019-04-13 NOTE — Anesthesia Preprocedure Evaluation (Signed)
Anesthesia Evaluation  Patient identified by MRN, date of birth, ID band Patient awake    Reviewed: Allergy & Precautions, NPO status , Patient's Chart, lab work & pertinent test results  Airway Mallampati: II  TM Distance: >3 FB     Dental   Pulmonary neg pulmonary ROS, former smoker,    breath sounds clear to auscultation       Cardiovascular hypertension, +CHF   Rhythm:Regular Rate:Normal     Neuro/Psych    GI/Hepatic negative GI ROS, Neg liver ROS,   Endo/Other  negative endocrine ROS  Renal/GU negative Renal ROS     Musculoskeletal   Abdominal   Peds  Hematology   Anesthesia Other Findings   Reproductive/Obstetrics                             Anesthesia Physical Anesthesia Plan  ASA: III  Anesthesia Plan: MAC   Post-op Pain Management:    Induction: Intravenous  PONV Risk Score and Plan: 1 and Treatment may vary due to age or medical condition  Airway Management Planned: Nasal Cannula and Simple Face Mask  Additional Equipment:   Intra-op Plan:   Post-operative Plan:   Informed Consent: I have reviewed the patients History and Physical, chart, labs and discussed the procedure including the risks, benefits and alternatives for the proposed anesthesia with the patient or authorized representative who has indicated his/her understanding and acceptance.     Dental advisory given  Plan Discussed with: CRNA and Anesthesiologist  Anesthesia Plan Comments:         Anesthesia Quick Evaluation

## 2019-04-13 NOTE — Interval H&P Note (Signed)
History and Physical Interval Note:  04/13/2019 11:01 AM  Dwayne Mendoza  has presented today for surgery, with the diagnosis of afib.  The various methods of treatment have been discussed with the patient and family. After consideration of risks, benefits and other options for treatment, the patient has consented to  Procedure(s): TRANSESOPHAGEAL ECHOCARDIOGRAM (TEE) (N/A) CARDIOVERSION (N/A) as a surgical intervention.  The patient's history has been reviewed, patient examined, no change in status, stable for surgery.  I have reviewed the patient's chart and labs.  Questions were answered to the patient's satisfaction.     [employer]

## 2019-04-13 NOTE — Anesthesia Postprocedure Evaluation (Signed)
Anesthesia Post Note  Patient: Dwayne Mendoza  Procedure(s) Performed: TRANSESOPHAGEAL ECHOCARDIOGRAM (TEE) (N/A ) CARDIOVERSION (N/A )     Patient location during evaluation: Endoscopy Anesthesia Type: MAC Level of consciousness: awake Pain management: pain level controlled Vital Signs Assessment: post-procedure vital signs reviewed and stable Cardiovascular status: stable Postop Assessment: no apparent nausea or vomiting Anesthetic complications: no    Last Vitals:  Vitals:   04/13/19 1202 04/13/19 1205  BP: (!) 107/47 (!) 103/41  Pulse: 66 65  Resp: (!) 30 (!) 29  Temp: 36.7 C   SpO2: 96% 96%    Last Pain:  Vitals:   04/13/19 1205  TempSrc:   PainSc: 0-No pain                 [name]

## 2019-04-13 NOTE — Progress Notes (Addendum)
PROGRESS NOTE  Dwayne Mendoza MWU:[id number] DOB: [birth year] DOA: 04/11/2019 PCP: Patient, No Pcp Per   LOS: 2 days   Patient is from: Home  Brief Narrative / Interim history: 73 year old male with no PMH except osteoarthritis presented to [facility] with worsening dyspnea on 5/25 and found to have A. fib with RVR, new systolic CHF with EF to 25 to 30% and hypertension.  He had Lexiscan nuclear stress test on 6/29 concerning for inferolateral ischemia and transferred to [facility] for further work-up.  He was diuresed with IV Lasix with significant improvement in his breathing. Labs at [facility] showed A1c of 6.4%, proBNP of 1340, TSH of 6.94 with normal T4, LDL 96.  COVID-19 negative.  CBC without significant finding.  PE was excluded by CTA chest at [facility].  Subjective: No acute issues or events overnight, declines chest pain, nausea, vomiting, diarrhea, constipation, headache, fevers, chills.  Objective: Vitals:   04/13/19 1205 04/13/19 1215 04/13/19 1225 04/13/19 1300  BP: (!) 103/41 (!) 101/51 (!) 114/56 107/65  Pulse: 65 65 66 72  Resp: (!) 29 13 17 18   Temp:      TempSrc:      SpO2: 96% 97% 97%   Weight:      Height:        Intake/Output Summary (Last 24 hours) at 04/13/2019 1610 Last data filed at 04/13/2019 1204 Gross per 24 hour  Intake 988.02 ml  Output 425 ml  Net 563.02 ml   Filed Weights   04/11/19 1444 04/12/19 0534 04/13/19 0406  Weight: 111 kg 110.1 kg 110.1 kg   Examination:  General:  Pleasantly resting in bed, No acute distress. HEENT:  Normocephalic atraumatic.  Sclerae nonicteric, noninjected.  Extraocular movements intact bilaterally. Neck:  Without mass or deformity.  Trachea is midline. Lungs:  Clear to auscultate bilaterally without rhonchi, wheeze, or rales. Heart:  Regular rate and rhythm.  Without murmurs, rubs, or gallops. Abdomen:  Soft, nontender, nondistended.  Without guarding or rebound. Extremities: Without cyanosis, clubbing,  edema, or obvious deformity. Vascular:  Dorsalis pedis and posterior tibial pulses palpable bilaterally. Skin:  Warm and dry, no erythema, no ulcerations.  I have personally reviewed the following labs and images:  Radiology Studies: No results found.  Microbiology: No results found for this or any previous visit (from the past 240 hour(s)).  Sepsis Labs: Invalid input(s): PROCALCITONIN, LACTICIDVEN  Urine analysis: No results found for: COLORURINE, APPEARANCEUR, LABSPEC, PHURINE, GLUCOSEU, HGBUR, BILIRUBINUR, KETONESUR, PROTEINUR, UROBILINOGEN, NITRITE, LEUKOCYTESUR  Anemia Panel: No results for input(s): VITAMINB12, FOLATE, FERRITIN, TIBC, IRON, RETICCTPCT in the last 72 hours.  Thyroid Function Tests: Recent Labs    04/11/19 1617  TSH 3.731  FREET4 0.81    Lipid Profile: No results for input(s): CHOL, HDL, LDLCALC, TRIG, CHOLHDL, LDLDIRECT in the last 72 hours.  CBG: No results for input(s): GLUCAP in the last 168 hours.  HbA1C: No results for input(s): HGBA1C in the last 72 hours.  BNP (last 3 results): No results for input(s): PROBNP in the last 8760 hours.  Cardiac Enzymes: No results for input(s): CKTOTAL, CKMB, CKMBINDEX, TROPONINI in the last 168 hours.  Coagulation Profile: Recent Labs  Lab 04/11/19 1617  INR 1.2    Liver Function Tests: Recent Labs  Lab 04/11/19 1617  AST 31  ALT 31  ALKPHOS 57  BILITOT 1.1  PROT 6.9  ALBUMIN 3.8   No results for input(s): LIPASE, AMYLASE in the last 168 hours. No results for input(s): AMMONIA in the last  168 hours.  Basic Metabolic Panel: Recent Labs  Lab 04/11/19 1617 04/12/19 0211 04/12/19 1454 04/12/19 1502 04/12/19 1505 04/13/19 0335  NA 138 141 142 142 143 140  K 3.5 3.2* 3.7 3.7 3.5 3.5  CL 104 105 106  --   --  108  CO2 25 25  --   --   --  23  GLUCOSE 112* 136* 142*  --   --  143*  BUN 27* 26* 24*  --   --  22  CREATININE 1.08 1.07 1.00  --   --  1.07  CALCIUM 9.2 9.5  --   --   --   9.5  MG 2.5* 2.4  --   --   --  2.4   GFR: Estimated Creatinine Clearance: 75.2 mL/min (by C-G formula based on SCr of 1.07 mg/dL).  CBC: Recent Labs  Lab 04/11/19 1617 04/12/19 0211 04/12/19 1454 04/12/19 1502 04/12/19 1505 04/13/19 0335  WBC 6.2 6.4  --   --   --  6.9  NEUTROABS 4.0  --   --   --   --   --   HGB 13.0 13.6 12.2* 11.9* 11.6* 12.3*  HCT 38.4* 40.1 36.0* 35.0* 34.0* 36.5*  MCV 87.1 87.0  --   --   --  87.1  PLT 175 175  --   --   --  169    Procedures:  Left heart cath  The left ventricular ejection fraction is 25-35% by visual estimate.  There is moderate to severe left ventricular systolic dysfunction.  LV end diastolic pressure is normal.  There is no aortic valve stenosis.  Prox RCA to Mid RCA lesion is 25% stenosed.  RPDA lesion is 40% stenosed.  Ost Cx to Prox Cx lesion is 25% stenosed.  1st Mrg lesion is 90% stenosed.  Mid LAD lesion is 25% stenosed.  2nd Diag lesion is 40% stenosed.  Ao sat 97%, PA sat 63%, mean PA pressure 20 mm Hg; mean PCWP 11 mm Hg; CO 4.8 L/min; CI 2.11  Echocardiogram - TEE  1. The left ventricle has severely reduced systolic function, with an ejection fraction of 20-25%. The cavity size was normal. Left ventricular diffuse hypokinesis.  2. The right ventricle has normal systolc function. The cavity was mildly enlarged. There is no increase in right ventricular wall thickness.  3. Left atrial size was moderately dilated.  4. No evidence of a thrombus present in the left atrial appendage.  5. Right atrial size was moderately dilated.  6. The mitral valve is grossly normal. Mild thickening of the mitral valve leaflet. Mitral valve regurgitation is mild to moderate by color flow Doppler.  7. The aortic valve is tricuspid Mild thickening of the aortic valve Mild calcification of the aortic valve. Aortic valve regurgitation is trivial by color flow Doppler.  Microbiology: COVID-19 negative  Assessment & Plan:  New  systolic CHF: 2D echo at [facility] on 6/25 showed mild LVH, severe global HK EF 25-30%, severely dilated LA, mildly enlarged RA, mild MR/TR. patient presented with progressive dyspnea on exertion.  proBNP at [facility] 1340. -Further plan per cardiology-LHC yesterday as above -TEE as above - successful cardioversion -Continue lasix, metoprolol, losartan, Aldactone, statin and aspirin -Daily weight, strict intake and output -Closely monitor electrolytes and replenish aggressively -Salt and fluid restriction  New A. Fib, cardioverted 04/14/19  Defer to cardiology for ongoing medications; previously on metoprolol and amiodarone gtt, heparin drip  Abnormal nuclear stress test/CAD:  No  chest pain no anginal symptoms now.   Troponin flat at 0.04.   No acute ischemic finding on EKG. -Metoprolol and heparin as above -Atorvastatin and aspirin -LHC as above  Hypertension: Normotensive -Metoprolol and losartan  History of osteoarthritis -PRN Tylenol  DVT prophylaxis: On heparin drip Code Status: Full code Disposition Plan: Remains inpatient for further cardiac evaluation and treatment - disposition in the next 24-48h pending clinical improvement and further discussion with cardiology. Consultants: Cardiology  Antimicrobials: Anti-infectives (From admission, onward)   None     Sch Meds:  Scheduled Meds: . aspirin EC  81 mg Oral Daily  . atorvastatin  80 mg Oral q1800  . losartan  25 mg Oral Daily  . metoprolol tartrate  100 mg Oral BID  . sodium chloride flush  3 mL Intravenous Q12H  . sodium chloride flush  3 mL Intravenous Q12H  . spironolactone  12.5 mg Oral Daily   Continuous Infusions: . sodium chloride    . amiodarone 30 mg/hr (04/12/19 1534)  . heparin 1,800 Units/hr (04/12/19 2212)   PRN Meds:.sodium chloride, acetaminophen, acetaminophen, nitroGLYCERIN, ondansetron (ZOFRAN) IV, ondansetron (ZOFRAN) IV, sodium chloride flush  35 minutes with more than 50% spent in reviewing records,  counseling patient and coordinating care.  Carma LeavenWilliam Joshuan Bolander III DO [facility]  If 7PM-7AM, please contact night-coverage [email or web address] Password [email or web address] 04/13/2019, 4:10 PM

## 2019-04-13 NOTE — Progress Notes (Signed)
Call placed to patient contact to give report, call was not picked up. Marcille Blanco, RN

## 2019-04-13 NOTE — Transfer of Care (Signed)
Immediate Anesthesia Transfer of Care Note  Patient: Dwayne Mendoza  Procedure(s) Performed: TRANSESOPHAGEAL ECHOCARDIOGRAM (TEE) (N/A ) CARDIOVERSION (N/A )  Patient Location: Endoscopy Unit  Anesthesia Type:General  Level of Consciousness: awake and alert   Airway & Oxygen Therapy: Patient Spontanous Breathing and Patient connected to nasal cannula oxygen  Post-op Assessment: Report given to RN and Post -op Vital signs reviewed and stable  Post vital signs: Reviewed and stable  Last Vitals:  Vitals Value Taken Time  BP 107/47 04/13/19 1202  Temp    Pulse 66 04/13/19 1202  Resp 30 04/13/19 1202  SpO2 96 % 04/13/19 1202    Last Pain:  Vitals:   04/13/19 1202  TempSrc: Oral  PainSc: Asleep         Complications: No apparent anesthesia complications

## 2019-04-13 NOTE — Progress Notes (Signed)
Progress Note  Patient Name: Dwayne Mendoza Date of Encounter: 04/13/2019  Primary Cardiologist: No primary care provider on file. = will need f/u in [facility] Primary Electrophysiologist:  None  Subjective   He is feeling better today, awaiting TEE/DCCV.  Inpatient Medications    Scheduled Meds: . [MAR Hold] aspirin EC  81 mg Oral Daily  . [MAR Hold] atorvastatin  80 mg Oral q1800  . [MAR Hold] losartan  25 mg Oral Daily  . [MAR Hold] metoprolol tartrate  100 mg Oral BID  . [MAR Hold] sodium chloride flush  3 mL Intravenous Q12H  . sodium chloride flush  3 mL Intravenous Q12H  . [MAR Hold] sodium chloride flush  3 mL Intravenous Q12H  . [MAR Hold] spironolactone  12.5 mg Oral Daily   Continuous Infusions: . sodium chloride 20 mL/hr at 04/13/19 1054  . sodium chloride    . [MAR Hold] sodium chloride    . amiodarone 30 mg/hr (04/12/19 1534)  . heparin 1,800 Units/hr (04/12/19 2212)   PRN Meds: [MAR Hold] sodium chloride, [MAR Hold] acetaminophen, [MAR Hold] acetaminophen, butamben-tetracaine-benzocaine, [MAR Hold] nitroGLYCERIN, [MAR Hold] ondansetron (ZOFRAN) IV, [MAR Hold] ondansetron (ZOFRAN) IV, sodium chloride flush, [MAR Hold] sodium chloride flush   Vital Signs    Vitals:   04/13/19 0032 04/13/19 0406 04/13/19 0919 04/13/19 1043  BP: 134/84 119/80 130/90 (!) 153/94  Pulse: 90 85 90 89  Resp: 19 18 18 20   Temp: 98.2 F (36.8 C) 98.1 F (36.7 C) 97.8 F (36.6 C) 98.4 F (36.9 C)  TempSrc: Oral Oral Oral Oral  SpO2: 98% 97% 97% 100%  Weight:  110.1 kg    Height:        Intake/Output Summary (Last 24 hours) at 04/13/2019 1141 Last data filed at 04/13/2019 0330 Gross per 24 hour  Intake 1152.58 ml  Output 575 ml  Net 577.58 ml   Last 3 Weights 04/13/2019 04/12/2019 04/11/2019  Weight (lbs) 242 lb 11.2 oz 242 lb 11.2 oz 244 lb 11.2 oz  Weight (kg) 110.088 kg 110.088 kg 110.995 kg      Telemetry    Atrial fibrillation with RVR up to 150 bpm- Personally  Reviewed  ECG    No new tracing- Personally Reviewed  Physical Exam   GEN: No acute distress.   Neck: No JVD Cardiac: iRRR, no murmurs, rubs, or gallops.  Respiratory:  Kolls at bases bilaterally clear to auscultation bilaterally.  3 out of 6 systolic murmur GI: Soft, nontender, non-distended  MS:  Mild bilateral edema; No deformity. Neuro:  Nonfocal  Psych: Normal affect   Labs    High Sensitivity Troponin:   Recent Labs  Lab 04/11/19 1617  TROPONINIHS 21*      Cardiac EnzymesNo results for input(s): TROPONINI in the last 168 hours. No results for input(s): TROPIPOC in the last 168 hours.   Chemistry Recent Labs  Lab 04/11/19 1617 04/12/19 0211 04/12/19 1454 04/12/19 1502 04/12/19 1505 04/13/19 0335  NA 138 141 142 142 143 140  K 3.5 3.2* 3.7 3.7 3.5 3.5  CL 104 105 106  --   --  108  CO2 25 25  --   --   --  23  GLUCOSE 112* 136* 142*  --   --  143*  BUN 27* 26* 24*  --   --  22  CREATININE 1.08 1.07 1.00  --   --  1.07  CALCIUM 9.2 9.5  --   --   --  9.5  PROT 6.9  --   --   --   --   --   ALBUMIN 3.8  --   --   --   --   --   AST 31  --   --   --   --   --   ALT 31  --   --   --   --   --   ALKPHOS 57  --   --   --   --   --   BILITOT 1.1  --   --   --   --   --   GFRNONAA >60 >60  --   --   --  >60  GFRAA >60 >60  --   --   --  >60  ANIONGAP 9 11  --   --   --  9     Hematology Recent Labs  Lab 04/11/19 1617 04/12/19 0211  04/12/19 1502 04/12/19 1505 04/13/19 0335  WBC 6.2 6.4  --   --   --  6.9  RBC 4.41 4.61  --   --   --  4.19*  HGB 13.0 13.6   < > 11.9* 11.6* 12.3*  HCT 38.4* 40.1   < > 35.0* 34.0* 36.5*  MCV 87.1 87.0  --   --   --  87.1  MCH 29.5 29.5  --   --   --  29.4  MCHC 33.9 33.9  --   --   --  33.7  RDW 13.3 13.3  --   --   --  13.5  PLT 175 175  --   --   --  169   < > = values in this interval not displayed.    BNP Recent Labs  Lab 04/11/19 1617  BNP 116.6*     DDimer No results for input(s): DDIMER in the last  168 hours.   Radiology    Dg Chest 1 View  Result Date: 04/11/2019 CLINICAL DATA:  73 year old male with shortness of breath. EXAM: CHEST  1 VIEW COMPARISON:  None. FINDINGS: Portable AP semi upright view at 1726 hours. Borderline to mild cardiomegaly. Other mediastinal contours are within normal limits. Visualized tracheal air column is within normal limits. Allowing for portable technique the lungs are clear. No pneumothorax or pleural effusion. Negative visible bowel gas pattern. Advanced degenerative changes at the left shoulder. IMPRESSION: No acute cardiopulmonary abnormality. Electronically Signed   By: Odessa FlemingH  Hall M.D.   On: 04/11/2019 18:19   Cardiac Studies     Patient Profile     73 y.o. male with no significant PMH except former tobacco abuse (15 yrs) who is being seen today for the evaluation of atrial fibrillation, new onset systolic CHF and abnormal stress test at [facility] of [facility]Autria [facility].  Assessment & Plan    Acute combined systolic diastolic CHF New cardiomyopathy of unknown etiology New atrial fibrillation with RVR High risk stress test  Cath yesterday showed stenosis in the OM1, out of proportion to the degree of LV dysfunction- most probably tachycardia induced.  He is waiting for TEE/DCCV today, if ok post procedure, we will plan for discharge and follow up in 1 week. We will switch heparin drip to Eliquis 5 mg PO BID. Continue metoprolol to 100 mg p.o. twice daily, continue aspirin, spironolactone, add atorvastatin and losartan 25 mg daily.  For questions or updates, please contact [facility] Please consult [email or web address] for  contact info under     Signed, Tobias Alexander, MD  04/13/2019, 11:41 AM

## 2019-04-13 NOTE — Progress Notes (Addendum)
ANTICOAGULATION CONSULT NOTE   Pharmacy Consult for Heparin Indication: atrial fibrillation and abnormal stress test for cards eval  No Known Allergies  Patient Measurements: Height: 5\' 10"  (177.8 cm) Weight: 242 lb 11.2 oz (110.1 kg)(scale b) IBW/kg (Calculated) : Dwayne Heparin Dosing Weight: 97.2 kg  Vital Signs: Temp: 98.1 F (36.7 C) (07/01 0406) Temp Source: Oral (07/01 0406) BP: 119/80 (07/01 0406) Pulse Rate: 85 (07/01 0406)  Labs: Recent Labs    04/11/19 1617 04/12/19 0211 04/12/19 0947 04/12/19 1454 04/12/19 1502 04/12/19 1505 04/13/19 0335  HGB 13.0 13.6  --  12.2* 11.9* 11.6* 12.3*  HCT 38.4* 40.1  --  36.0* 35.0* 34.0* 36.5*  PLT 175 175  --   --   --   --  169  APTT 69*  --   --   --   --   --   --   LABPROT 15.4*  --   --   --   --   --   --   INR 1.2  --   --   --   --   --   --   HEPARINUNFRC 0.18* 0.45 0.61  --   --   --  0.43  CREATININE 1.08 1.07  --  1.00  --   --  1.07  TROPONINIHS 21*  --   --   --   --   --   --     Estimated Creatinine Clearance: 75.2 mL/min (by C-G formula based on SCr of 1.07 mg/dL).   Medical History: Past Medical History:  Diagnosis Date  . Arthritis     Medications:  Infusions:  . sodium chloride    . amiodarone 30 mg/hr (04/12/19 1534)  . heparin 1,800 Units/hr (04/12/19 2212)    Assessment: Dwayne Mendoza who presented to [facility] with 1-2 weeks of shortness of breath and found to be in Afib with RVR transferred to [facility] on 6/29 for cards evaluation after an abnormal stress test.. Patient has been on IV Heparin since 6/25 at [place] with a current rate of 1550 units/hr upon transfer. Pharmacy consulted to dose Heparin this admission.   Underwent cardiac cath 6/30- plan to treat as heart failure and if continue to have angina, consider PCI of OM  Heparin level this morning came back therapeutic at 0.43, on 1800 units/hr. Hgb 12.3, plt 169. No s/sx of bleeding. No infusion issues.  Goal of Therapy:  Heparin  level 0.3-0.7 units/ml Monitor platelets by anticoagulation protocol: Yes   Plan:   - Continue heparin drip rate at 1800 units/hr (18 ml/hr) - Monitor daily HL, CBC, and for s/sx of bleeding -F/u plan for apixaban post TEE  Thank you for allowing pharmacy to be a part of this patient's care.  Antonietta Jewel, PharmD, BCCCP Clinical Pharmacist  Pager: [phone] Phone: [phone] Clinical phone for 04/13/2019: [phone] 04/13/2019 8:10 AM   **Pharmacist phone directory can now be found on [email or web address] ([email or web address]).  Listed under [place].

## 2019-04-13 NOTE — CV Procedure (Signed)
   Transesophageal Echocardiogram  Indications: Cardioversion. AFIB  Time out performed  Propofol   Findings:  Left Ventricle: EF 25%  Mitral Valve: Mild MR  Aortic Valve: Trace AI  Tricuspid Valve: Normal  Left Atrium: No LAA thrombus, however "smoke" present   Successful cardioversion, 120J x 1.   Candee Furbish, MD

## 2019-04-14 LAB — BASIC METABOLIC PANEL
Anion gap: 11 (ref 5–15)
BUN: 23 mg/dL (ref 8–23)
CO2: 23 mmol/L (ref 22–32)
Calcium: 9.1 mg/dL (ref 8.9–10.3)
Chloride: 107 mmol/L (ref 98–111)
Creatinine, Ser: 1.37 mg/dL — ABNORMAL HIGH (ref 0.61–1.24)
GFR calc Af Amer: 58 mL/min — ABNORMAL LOW (ref >60)
GFR calc non Af Amer: 50 mL/min — ABNORMAL LOW (ref >60)
Glucose, Bld: 115 mg/dL — ABNORMAL HIGH (ref 70–99)
Potassium: 3.8 mmol/L (ref 3.5–5.1)
Sodium: 141 mmol/L (ref 135–145)

## 2019-04-14 LAB — CBC
HCT: 34.1 % — ABNORMAL LOW (ref 39.0–52.0)
Hemoglobin: 11.1 g/dL — ABNORMAL LOW (ref 13.0–17.0)
MCH: 29 pg (ref 26.0–34.0)
MCHC: 32.6 g/dL (ref 30.0–36.0)
MCV: 89 fL (ref 80.0–100.0)
Platelets: 141 10*3/uL — ABNORMAL LOW (ref 150–400)
RBC: 3.83 MIL/uL — ABNORMAL LOW (ref 4.22–5.81)
RDW: 13.6 % (ref 11.5–15.5)
WBC: 5.3 10*3/uL (ref 4.0–10.5)
nRBC: 0 % (ref 0.0–0.2)

## 2019-04-14 LAB — HEPARIN LEVEL (UNFRACTIONATED): Heparin Unfractionated: 0.74 IU/mL — ABNORMAL HIGH (ref 0.30–0.70)

## 2019-04-14 MED ORDER — APIXABAN 5 MG PO TABS: 5.0000 mg | ORAL_TABLET | Freq: Two times a day (BID) | ORAL | 0 refills | Status: AC

## 2019-04-14 MED ORDER — SPIRONOLACTONE 25 MG PO TABS: 12.5000 mg | ORAL_TABLET | Freq: Every day | ORAL | 0 refills | Status: AC

## 2019-04-14 MED ORDER — LOSARTAN POTASSIUM 25 MG PO TABS: 25.0000 mg | ORAL_TABLET | Freq: Every day | ORAL | 0 refills | Status: AC

## 2019-04-14 MED ORDER — METOPROLOL TARTRATE 100 MG PO TABS: 100.0000 mg | ORAL_TABLET | Freq: Two times a day (BID) | ORAL | 0 refills | Status: AC

## 2019-04-14 MED ORDER — APIXABAN 5 MG PO TABS
5.0000 mg | ORAL_TABLET | Freq: Two times a day (BID) | ORAL | Status: DC
  Administered 2019-04-14: 5 mg via ORAL
  Filled 2019-04-14: qty 1

## 2019-04-14 MED ORDER — ASPIRIN 81 MG PO TBEC: 81.0000 mg | DELAYED_RELEASE_TABLET | Freq: Every day | ORAL | 0 refills | Status: AC

## 2019-04-14 MED ORDER — ATORVASTATIN CALCIUM 80 MG PO TABS: 80.0000 mg | ORAL_TABLET | Freq: Every day | ORAL | 0 refills | Status: AC

## 2019-04-14 NOTE — Progress Notes (Signed)
Patient discharged:   Via: Wheelchair   Discharge paperwork given: to patient  Reviewed with teach back  IV and telemetry disconnected  Belongings given to patient

## 2019-04-14 NOTE — Discharge Summary (Signed)
Physician Discharge Summary  Lane HackerMichael Kerstein ZOX:[id number] DOB: 1946-08-29 DOA: 04/11/2019  PCP: Patient, No Pcp Per  Admit date: 04/11/2019 Discharge date: 04/14/2019  Admitted From: Home Disposition: Home  Recommendations for Outpatient Follow-up:  1. Follow up with PCP/cardiology in 1-2 weeks 2. Please obtain BMP/CBC in one week  Discharge Condition: Stable CODE STATUS: Full Diet recommendation: Cardiac prudent, low-salt, low-fat  Brief/Interim Summary: 73 year old male with no PMH except osteoarthritis presented to [facility] with worsening dyspnea on 5/25 and found to have A. fib with RVR, new systolic CHF with EF to 25 to 81%30% and hypertension.  He had Lexiscan nuclear stress test on 6/29 concerning for inferolateral ischemia and transferred to [facility] for [facility] work-up.  He was diuresed with IV Lasix with significant improvement in his breathing. Labs at [facility] showed A1c of 6.4%, proBNP of 1340, TSH of 6.94 with normal T4, LDL 96.  COVID-19 negative.  CBC without significant finding.  PE was excluded [facility].  Patient admitted as above with acute uncontrolled A. fib with newly diagnosed heart failure EF 25 to 30% now status post TEE to rule out thrombus and subsequent cardioversion.  Now feels remarkably improved from previous, states he no longer feels weak, tired, maintains in sinus rhythm status post cardioversion.  She has been followed by cardiology, appreciate insight and recommendations, patient continues on core measures including spironolactone, atorvastatin, losartan, aspirin, metoprolol, Eliquis. Patient had previously been on amiodarone and heparin drip for rate control and anticoagulation respectively which have been discontinued and patient continues to improve.  Patient ambulating today without symptomatology, tolerating p.o. well, again remains in sinus rhythm and is otherwise stable and agreeable for discharge home.  Close follow-up with PCP and  cardiology in the near future for further evaluation and treatment per their recommendations.  Discharge Diagnoses:  Principal Problem:   Atrial fibrillation with RVR (HCC) Active Problems:   Acute on chronic combined systolic and diastolic CHF (congestive heart failure) (HCC)   Elevated troponin   Essential hypertension   Ground glass opacity present on imaging of lung    Discharge Instructions Continue all medications as discussed, follow-up with PCP and cardiology in 1 to 2 weeks as scheduled, should you have any return of symptoms, lightheadedness, dizziness, shortness of breath, notice any bleeding, dark stool, coffee-ground vomit or occultly controlling bleeding please report immediately back to the ED for further evaluation and treatment. Discharge Instructions    (HEART FAILURE PATIENTS) Call MD:  Anytime you have any of the following symptoms: 1) 3 pound weight gain in 24 hours or 5 pounds in 1 week 2) shortness of breath, with or without a dry hacking cough 3) swelling in the hands, feet or stomach 4) if you have to sleep on extra pillows at night in order to breathe.   Complete by: As directed    Call MD for:  difficulty breathing, headache or visual disturbances   Complete by: As directed    Call MD for:  persistant dizziness or light-headedness   Complete by: As directed    Call MD for:  redness, tenderness, or signs of infection (pain, swelling, redness, odor or green/yellow discharge around incision site)   Complete by: As directed    Diet - low sodium heart healthy   Complete by: As directed    Diet - low sodium heart healthy   Complete by: As directed    Discharge instructions   Complete by: As directed    Please continue  to take all medications as directed, monitor closely for signs or symptoms of bleeding, maintain daily weights, salt restriction and fluid restriction diet as discussed at bedside.   Heart Failure patients record your daily weight using the same scale  at the same time of day   Complete by: As directed    Increase activity slowly   Complete by: As directed    Increase activity slowly   Complete by: As directed    STOP any activity that causes chest pain, shortness of breath, dizziness, sweating, or exessive weakness   Complete by: As directed      Allergies as of 04/14/2019   No Known Allergies     Medication List    STOP taking these medications   ibuprofen 200 MG tablet Commonly known as: ADVIL     TAKE these medications   apixaban 5 MG Tabs tablet Commonly known as: ELIQUIS Take 1 tablet (5 mg total) by mouth 2 (two) times daily.   aspirin 81 MG EC tablet Take 1 tablet (81 mg total) by mouth daily. Start taking on: April 15, 2019   atorvastatin 80 MG tablet Commonly known as: LIPITOR Take 1 tablet (80 mg total) by mouth daily at 6 PM.   losartan 25 MG tablet Commonly known as: COZAAR Take 1 tablet (25 mg total) by mouth daily. Start taking on: April 15, 2019   metoprolol tartrate 100 MG tablet Commonly known as: LOPRESSOR Take 1 tablet (100 mg total) by mouth 2 (two) times daily.   spironolactone 25 MG tablet Commonly known as: ALDACTONE Take 0.5 tablets (12.5 mg total) by mouth daily. Start taking on: April 15, 2019       No Known Allergies  Consultations:  Cardiology   Procedures/Studies: Dg Chest 1 View  Result Date: 04/11/2019 CLINICAL DATA:  73 year old male with shortness of breath. EXAM: CHEST  1 VIEW COMPARISON:  None. FINDINGS: Portable AP semi upright view at 1726 hours. Borderline to mild cardiomegaly. Other mediastinal contours are within normal limits. Visualized tracheal air column is within normal limits. Allowing for portable technique the lungs are clear. No pneumothorax or pleural effusion. Negative visible bowel gas pattern. Advanced degenerative changes at the left shoulder. IMPRESSION: No acute cardiopulmonary abnormality. Electronically Signed   By: Odessa Fleming M.D.   On: 04/11/2019 18:19     Subjective: No acute issues or events overnight, patient indicates he feels much improved from previous, declines chest pain, shortness of breath, nausea, vomiting, diarrhea, constipation, headache, fevers, chills.  Discharge Exam: Vitals:   04/14/19 0832 04/14/19 1136  BP:  124/74  Pulse: 75 71  Resp:  18  Temp:  97.8 F (36.6 C)  SpO2:  95%   Vitals:   04/14/19 0528 04/14/19 0800 04/14/19 0832 04/14/19 1136  BP: 117/90 123/75  124/74  Pulse: 72  75 71  Resp: 18   18  Temp: 97.7 F (36.5 C) 98.1 F (36.7 C)  97.8 F (36.6 C)  TempSrc: Oral Oral  Oral  SpO2: 97% 98%  95%  Weight: 111.6 kg     Height:        General:  Pleasantly resting in bed, No acute distress. HEENT:  Normocephalic atraumatic.  Sclerae nonicteric, noninjected.  Extraocular movements intact bilaterally. Neck:  Without mass or deformity.  Trachea is midline. Lungs:  Clear to auscultate bilaterally without rhonchi, wheeze, or rales. Heart:  Regular rate and rhythm.  Without murmurs, rubs, or gallops. Abdomen:  Soft, nontender, nondistended.  Without guarding  or rebound. Extremities: Without cyanosis, clubbing, edema, or obvious deformity. Vascular:  Dorsalis pedis and posterior tibial pulses palpable bilaterally. Skin:  Warm and dry, no erythema, no ulcerations.   The results of significant diagnostics from this hospitalization (including imaging, microbiology, ancillary and laboratory) are listed below for reference.     Microbiology: No results found for this or any previous visit (from the past 240 hour(s)).   Labs: BNP (last 3 results) Recent Labs    04/11/19 1617  BNP 202.5*   Basic Metabolic Panel: Recent Labs  Lab 04/11/19 1617 04/12/19 0211 04/12/19 1454 04/12/19 1502 04/12/19 1505 04/13/19 0335 04/14/19 0341  NA 138 141 142 142 143 140 141  K 3.5 3.2* 3.7 3.7 3.5 3.5 3.8  CL 104 105 106  --   --  108 107  CO2 25 25  --   --   --  23 23  GLUCOSE 112* 136* 142*  --   --   143* 115*  BUN 27* 26* 24*  --   --  22 23  CREATININE 1.08 1.07 1.00  --   --  1.07 1.37*  CALCIUM 9.2 9.5  --   --   --  9.5 9.1  MG 2.5* 2.4  --   --   --  2.4  --    Liver Function Tests: Recent Labs  Lab 04/11/19 1617  AST 31  ALT 31  ALKPHOS 57  BILITOT 1.1  PROT 6.9  ALBUMIN 3.8   No results for input(s): LIPASE, AMYLASE in the last 168 hours. No results for input(s): AMMONIA in the last 168 hours. CBC: Recent Labs  Lab 04/11/19 1617 04/12/19 0211 04/12/19 1454 04/12/19 1502 04/12/19 1505 04/13/19 0335 04/14/19 0341  WBC 6.2 6.4  --   --   --  6.9 5.3  NEUTROABS 4.0  --   --   --   --   --   --   HGB 13.0 13.6 12.2* 11.9* 11.6* 12.3* 11.1*  HCT 38.4* 40.1 36.0* 35.0* 34.0* 36.5* 34.1*  MCV 87.1 87.0  --   --   --  87.1 89.0  PLT 175 175  --   --   --  169 141*   Cardiac Enzymes: No results for input(s): CKTOTAL, CKMB, CKMBINDEX, TROPONINI in the last 168 hours. BNP: Invalid input(s): POCBNP CBG: No results for input(s): GLUCAP in the last 168 hours. D-Dimer No results for input(s): DDIMER in the last 72 hours. Hgb A1c No results for input(s): HGBA1C in the last 72 hours. Lipid Profile No results for input(s): CHOL, HDL, LDLCALC, TRIG, CHOLHDL, LDLDIRECT in the last 72 hours. Thyroid function studies Recent Labs    04/11/19 1617  TSH 3.731   Anemia work up No results for input(s): VITAMINB12, FOLATE, FERRITIN, TIBC, IRON, RETICCTPCT in the last 72 hours. Urinalysis No results found for: COLORURINE, APPEARANCEUR, LABSPEC, [place], GLUCOSEU, HGBUR, BILIRUBINUR, KETONESUR, PROTEINUR, UROBILINOGEN, NITRITE, LEUKOCYTESUR Sepsis Labs Invalid input(s): PROCALCITONIN,  WBC,  LACTICIDVEN Microbiology No results found for this or any previous visit (from the past 240 hour(s)).   Time coordinating discharge: Over 30 minutes  SIGNED:  Little Ishikawa, DO [facility] 04/14/2019, 12:53 PM Pager   If 7PM-7AM, please contact  night-coverage [email or web address] Password [email or web address]

## 2019-04-14 NOTE — Progress Notes (Signed)
Physical Therapy Treatment Patient Details Name: Dwayne Mendoza MRN: [id number] DOB: [birth year] Today's Date: 04/14/2019    History of Present Illness 73 yo admitted to [facility] 6/25 with SOB with new onset Afib with RVR, CHFand  pulmonary edema 6/29 with acute STEMI and transferred to [facility]. PMHx: arthritis    PT Comments    Patient progressing with ambulation with cane this session and appropriate for home when stable for d/c.  Denies plans for follow up cardiac rehab so discussed gradual return to activities, breaks between normal ADL's and not using push mower initially up returning home.  PT to follow until d/c.    Follow Up Recommendations  No PT follow up     Equipment Recommendations  None recommended by PT    Recommendations for Other Services       Precautions / Restrictions Precautions Precautions: None    Mobility  Bed Mobility Overal bed mobility: Independent                Transfers Overall transfer level: Modified independent Equipment used: None;Straight cane             General transfer comment: up in room after toileting on his own when I entered, reports hip stiffness limits him so used cane for mobility during session  Ambulation/Gait Ambulation/Gait assistance: Modified independent (Device/Increase time) Gait Distance (Feet): 400 Feet Assistive device: Straight cane Gait Pattern/deviations: Step-to pattern;Step-through pattern;Antalgic     General Gait Details: HR maintained 70's-80's throughout session with pt mildly dyspneic wearing mask in hallway.  Discussed during ambulation slow return to activities with planning rest between normal ADL activities as needed,   Stairs             Wheelchair Mobility    Modified Rankin (Stroke Patients Only)       Balance Overall balance assessment: Needs assistance Sitting-balance support: Feet supported Sitting balance-Leahy Scale: Good       Standing balance-Leahy Scale:  Good Standing balance comment: washing hands at sink and mobilizing in room no device, but with limp on L                            Cognition Arousal/Alertness: Awake/alert Behavior During Therapy: WFL for tasks assessed/performed Overall Cognitive Status: Within Functional Limits for tasks assessed                                        Exercises      General Comments General comments (skin integrity, edema, etc.): did not practice stairs as pt plans to stay on main level for awhile      Pertinent Vitals/Pain Pain Assessment: No/denies pain    Home Living                      Prior Function            PT Goals (current goals can now be found in the care plan section) Progress towards PT goals: Progressing toward goals    Frequency    Min 3X/week      PT Plan Current plan remains appropriate    Co-evaluation              AM-PAC PT "6 Clicks" Mobility   Outcome Measure  Help needed turning from your back to your side while in a flat bed  without using bedrails?: None Help needed moving from lying on your back to sitting on the side of a flat bed without using bedrails?: None Help needed moving to and from a bed to a chair (including a wheelchair)?: None Help needed standing up from a chair using your arms (e.g., wheelchair or bedside chair)?: None Help needed to walk in hospital room?: None Help needed climbing 3-5 steps with a railing? : A Little 6 Click Score: 23    End of Session   Activity Tolerance: Patient tolerated treatment well Patient left: in bed;with call bell/phone within reach   PT Visit Diagnosis: Other abnormalities of gait and mobility (R26.89)     Time: 2633-3545 PT Time Calculation (min) (ACUTE ONLY): 21 min  Charges:  $Gait Training: 8-22 mins                     Sheran Lawless, PT Acute Rehabilitation Services [phone] 04/14/2019    [name] 04/14/2019, 12:36 PM

## 2019-04-14 NOTE — Discharge Instructions (Signed)

## 2019-04-14 NOTE — Care Management Important Message (Signed)
Important Message  Patient Details  Name: Dwayne Mendoza MRN: [id number] Date of Birth: June 16, 1946   Medicare Important Message Given:  Yes     [name] 04/14/2019, 2:22 PM

## 2019-04-14 NOTE — Progress Notes (Addendum)
Progress Note  Patient Name: Dwayne Mendoza Date of Encounter: 04/14/2019  Primary Cardiologist: No primary care provider on file. = will need f/u in [facility] Electrophysiologist:  None  Subjective   He is feeling better today, no chest pain or SOB.  Inpatient Medications    Scheduled Meds: . aspirin EC  81 mg Oral Daily  . atorvastatin  80 mg Oral q1800  . losartan  25 mg Oral Daily  . metoprolol tartrate  100 mg Oral BID  . sodium chloride flush  3 mL Intravenous Q12H  . sodium chloride flush  3 mL Intravenous Q12H  . spironolactone  12.5 mg Oral Daily   Continuous Infusions: . sodium chloride    . amiodarone 30 mg/hr (04/14/19 0835)  . heparin 1,650 Units/hr (04/14/19 0836)   PRN Meds: sodium chloride, acetaminophen, acetaminophen, nitroGLYCERIN, ondansetron (ZOFRAN) IV, ondansetron (ZOFRAN) IV, sodium chloride flush   Vital Signs    Vitals:   04/13/19 1300 04/14/19 0528 04/14/19 0800 04/14/19 0832  BP: 107/65 117/90 123/75   Pulse: 72 72  75  Resp: 18 18    Temp:  97.7 F (36.5 C) 98.1 F (36.7 C)   TempSrc:  Oral Oral   SpO2:  97% 98%   Weight:  111.6 kg    Height:        Intake/Output Summary (Last 24 hours) at 04/14/2019 1010 Last data filed at 04/14/2019 0723 Gross per 24 hour  Intake 658.58 ml  Output 880 ml  Net -221.42 ml   Last 3 Weights 04/14/2019 04/13/2019 04/12/2019  Weight (lbs) 246 lb 1.6 oz 242 lb 11.2 oz 242 lb 11.2 oz  Weight (kg) 111.63 kg 110.088 kg 110.088 kg      Telemetry    Atrial fibrillation with RVR up to 150 bpm- Personally Reviewed  ECG    No new tracing- Personally Reviewed  Physical Exam   GEN: No acute distress.   Neck: No JVD Cardiac: iRRR, no murmurs, rubs, or gallops.  Respiratory:  Kolls at bases bilaterally clear to auscultation bilaterally.  3 out of 6 systolic murmur GI: Soft, nontender, non-distended  MS:  Mild bilateral edema; No deformity. Neuro:  Nonfocal  Psych: Normal affect   Labs   High Sensitivity Troponin:   Recent Labs  Lab 04/11/19 1617  TROPONINIHS 21*      Cardiac EnzymesNo results for input(s): TROPONINI in the last 168 hours. No results for input(s): TROPIPOC in the last 168 hours.   Chemistry Recent Labs  Lab 04/11/19 1617 04/12/19 0211 04/12/19 1454  04/12/19 1505 04/13/19 0335 04/14/19 0341  NA 138 141 142   < > 143 140 141  K 3.5 3.2* 3.7   < > 3.5 3.5 3.8  CL 104 105 106  --   --  108 107  CO2 25 25  --   --   --  23 23  GLUCOSE 112* 136* 142*  --   --  143* 115*  BUN 27* 26* 24*  --   --  22 23  CREATININE 1.08 1.07 1.00  --   --  1.07 1.37*  CALCIUM 9.2 9.5  --   --   --  9.5 9.1  PROT 6.9  --   --   --   --   --   --   ALBUMIN 3.8  --   --   --   --   --   --   AST 31  --   --   --   --   --   --  ALT 31  --   --   --   --   --   --   ALKPHOS 57  --   --   --   --   --   --   BILITOT 1.1  --   --   --   --   --   --   GFRNONAA >60 >60  --   --   --  >60 50*  GFRAA >60 >60  --   --   --  >60 58*  ANIONGAP 9 11  --   --   --  9 11   < > = values in this interval not displayed.     Hematology Recent Labs  Lab 04/12/19 0211  04/12/19 1505 04/13/19 0335 04/14/19 0341  WBC 6.4  --   --  6.9 5.3  RBC 4.61  --   --  4.19* 3.83*  HGB 13.6   < > 11.6* 12.3* 11.1*  HCT 40.1   < > 34.0* 36.5* 34.1*  MCV 87.0  --   --  87.1 89.0  MCH 29.5  --   --  29.4 29.0  MCHC 33.9  --   --  33.7 32.6  RDW 13.3  --   --  13.5 13.6  PLT 175  --   --  169 141*   < > = values in this interval not displayed.    BNP Recent Labs  Lab 04/11/19 1617  BNP 116.6*     DDimer No results for input(s): DDIMER in the last 168 hours.   Radiology    No results found. Cardiac Studies     Patient Profile     73 y.o. male with no significant PMH except former tobacco abuse (15 yrs) who is being seen today for the evaluation of atrial fibrillation, new onset systolic CHF and abnormal stress test at the request of Dr. Geroge Baseman at [facility].   Assessment & Plan    Acute combined systolic diastolic CHF New cardiomyopathy of unknown etiology New atrial fibrillation with RVR High risk stress test  Cath yesterday showed stenosis in the OM1, out of proportion to the degree of LV dysfunction- most probably tachycardia induced.  He is post TEE/DCCV and remains in SR.  He is ok to be discharged today, we will arrange for follow up in 1 week. We will switch heparin drip to Eliquis 5 mg PO BID. Continue metoprolol to 100 mg p.o. twice daily, continue aspirin, spironolactone, atorvastatin and losartan 25 mg daily. Discontinue amiodarone and heparin drip.  For questions or updates, please contact [facility] Please consult [email or web address] for contact info under     Signed, Tobias Alexander, MD  04/14/2019, 10:10 AM

## 2019-04-14 NOTE — TOC Transition Note (Signed)
Transition of Care [facility]) - CM/SW Discharge Note   Patient Details  Name: Dwayne Mendoza MRN: [id number] Date of Birth: May 08, 1946  [facility] CM/SW Contact:  Bethena Roys, RN Phone Number: 04/14/2019, 2:53 PM   Clinical Narrative:   Pt presented for Atrial Fib. Pt to transition home on [employer] uses [employer]. 30 day free card provided. Patient is without PCP and CM did make PCP appointment in [place]. Patient has traditional Medicare-and information placed on AVS. Patient has transportation home, no further needs from CM at this time.    Final next level of care: Home/Self Care Barriers to Discharge: No Barriers Identified   Patient Goals and CMS Choice Patient states their goals for this hospitalization and ongoing recovery are:: to feel better      Discharge Placement                       Discharge Plan and Services In-house Referral: NA Discharge Planning Services: Follow-up appt scheduled, Medication Assistance(Eliquis New PCP) Post Acute Care Choice: NA                    HH Arranged: NA          Social Determinants of Health (SDOH) Interventions     Readmission Risk Interventions No flowsheet data found.

## 2019-04-14 NOTE — Progress Notes (Signed)
Pt will call and update family

## 2019-04-14 NOTE — Progress Notes (Addendum)
ANTICOAGULATION CONSULT NOTE   Pharmacy Consult for Heparin Indication: atrial fibrillation and abnormal stress test for cards eval  No Known Allergies  Patient Measurements: Height: 5\' 10"  (177.8 cm) Weight: 246 lb 1.6 oz (111.6 kg)(scale b) IBW/kg (Calculated) : 73 Heparin Dosing Weight: 97.2 kg  Vital Signs: Temp: 97.7 F (36.5 C) (07/02 0528) Temp Source: Oral (07/02 0528) BP: 117/90 (07/02 0528) Pulse Rate: 72 (07/02 0528)  Labs: Recent Labs    04/11/19 1617 04/12/19 0211 04/12/19 0947 04/12/19 1454  04/12/19 1505 04/13/19 0335 04/14/19 0341  HGB 13.0 13.6  --  12.2*   < > 11.6* 12.3* 11.1*  HCT 38.4* 40.1  --  36.0*   < > 34.0* 36.5* 34.1*  PLT 175 175  --   --   --   --  169 141*  APTT 69*  --   --   --   --   --   --   --   LABPROT 15.4*  --   --   --   --   --   --   --   INR 1.2  --   --   --   --   --   --   --   HEPARINUNFRC 0.18* 0.45 0.61  --   --   --  0.43 0.74*  CREATININE 1.08 1.07  --  1.00  --   --  1.07 1.37*  TROPONINIHS 21*  --   --   --   --   --   --   --    < > = values in this interval not displayed.    Estimated Creatinine Clearance: 59.1 mL/min (A) (by C-G formula based on SCr of 1.37 mg/dL (H)).   Medical History: Past Medical History:  Diagnosis Date  . Arthritis     Medications:  Infusions:  . sodium chloride    . amiodarone 30 mg/hr (04/12/19 1534)  . heparin 1,800 Units/hr (04/12/19 2212)    Assessment: 55 YOM who presented to [facility] with 1-2 weeks of shortness of breath and found to be in Afib with RVR transferred to [facility] on 6/29 for cards evaluation after an abnormal stress test.. Patient has been on IV Heparin since 6/25 at [place] with a current rate of 1550 units/hr upon transfer. Pharmacy consulted to dose Heparin this admission.   Underwent cardiac cath 6/30- medical management. Heparin level this morning came back supratherapeutic at 0.74, on 1800 units/hr. Hgb 11, plt 141. No s/sx of bleeding. No  infusion issues.   Goal of Therapy:  Heparin level 0.3-0.7 units/ml Monitor platelets by anticoagulation protocol: Yes   Plan:   -Reduce heparin drip rate to 1650 units/hr -Monitor daily HL, CBC, and for s/sx of bleeding -F/u plan for apixaban given successful cardioversion  Thank you for allowing pharmacy to be a part of this patient's care.  Antonietta Jewel, PharmD, [place] Clinical Pharmacist  Pager: [phone] Phone: [phone] Clinical phone for 04/14/2019: [id number] 04/14/2019 8:21 AM   **Pharmacist phone directory can now be found on [email or web address] ([email or web address]).  Listed under [place].   ADDENDUM Plan to transition from heparin infusion to apixaban. Will discontinue heparin infusion. Given age<80, Scr<1.5, and wt>60 kg, okay to dose at apixaban 5 mg twice daily. Monitor CBC and renal fx.   Antonietta Jewel, PharmD, [place] Clinical Pharmacist

## 2019-06-01 ENCOUNTER — Ambulatory Visit (INDEPENDENT_AMBULATORY_CARE_PROVIDER_SITE_OTHER): Payer: Medicare Other | Admitting: Cardiology

## 2019-06-01 ENCOUNTER — Encounter: Payer: Self-pay | Admitting: *Deleted

## 2019-06-01 ENCOUNTER — Other Ambulatory Visit: Payer: Self-pay

## 2019-06-01 VITALS — BP 148/76 | HR 81 | Temp 97.7°F | Ht 70.0 in | Wt 242.2 lb

## 2019-06-01 DIAGNOSIS — I428 Other cardiomyopathies: Secondary | ICD-10-CM | POA: Diagnosis not present

## 2019-06-01 DIAGNOSIS — I10 Essential (primary) hypertension: Secondary | ICD-10-CM | POA: Diagnosis not present

## 2019-06-01 DIAGNOSIS — I25119 Atherosclerotic heart disease of native coronary artery with unspecified angina pectoris: Secondary | ICD-10-CM | POA: Diagnosis not present

## 2019-06-01 DIAGNOSIS — I480 Paroxysmal atrial fibrillation: Secondary | ICD-10-CM | POA: Insufficient documentation

## 2019-06-01 NOTE — Progress Notes (Signed)
Cardiology Office Note:    Date:  06/01/2019   ID:  Dwayne Mendoza, DOB May 25, 1946, MRN [id number]  PCP:  Serita Grammes, MD  Cardiologist:  Jenean Lindau, MD   Referring MD: Little Ishikawa, MD    ASSESSMENT:    1. PAF (paroxysmal atrial fibrillation) ([place])   2. Essential hypertension   3. Nonischemic cardiomyopathy ([place])   4. Coronary artery disease involving native coronary artery of native heart with angina pectoris ([place])    PLAN:    In order of problems listed above:  1. Coronary artery disease: I discussed my findings with the patient extensively.  He is on appropriate medications.  Secondary prevention stressed.  Importance of compliance with diet and medication stressed and weight reduction was stressed.  Diet was discussed I also want him to come back for blood work in the next few days which will be fasting lipids among other lab work.  On that day we will also get an echocardiogram to assess a follow-up on his left ventricular systolic function. 2. Essential hypertension: Blood pressure is stable.  He checks it at home and he tells me that it was stable 3. Cardiomyopathy: Echocardiogram will help me assess the status of this issue and diagnosis.  He might need Entresto in the future if his systolic function is not improved significantly. 4. Paroxysmal atrial fibrillation: Currently patient is in sinus rhythm.I discussed with the patient atrial fibrillation, disease process. Management and therapy including rate and rhythm control, anticoagulation benefits and potential risks were discussed extensively with the patient. Patient had multiple questions which were answered to patient's satisfaction. 5. Patient will be seen in follow-up appointment in 1 months or earlier if the patient has any concerns    Medication Adjustments/Labs and Tests Ordered: Current medicines are reviewed at length with the patient today.  Concerns regarding medicines are outlined above.  No  orders of the defined types were placed in this encounter.  No orders of the defined types were placed in this encounter.    No chief complaint on file.    History of Present Illness:    Dwayne Mendoza is a 73 y.o. male.  Patient was recently in the hospital for abnormal nuclear stress test and atrial fibrillation with rapid ventricular rate.  He underwent a TEE and cardioversion and coronary angiography.  The reports are mentioned in detail below.  Essentially he has nonobstructive coronary artery disease.  His cardiomyopathy might be related to atrial fibrillation with rapid ventricular rate.  At any rate he is on anticoagulation and tolerating it well.  He denies any problems at this time and takes care of activities of daily living.  He has orthopedic issues with his hip and therefore he leads a sedentary lifestyle.  At the time of my evaluation, the patient is alert awake oriented and in no distress.  Past Medical History:  Diagnosis Date  . Acute on chronic combined systolic and diastolic CHF (congestive heart failure) ([place]) 04/11/2019  . Arthritis   . Atrial fibrillation with RVR ([place]) 04/11/2019  . Essential hypertension 04/11/2019    Past Surgical History:  Procedure Laterality Date  . CARDIOVERSION N/A 04/13/2019   Procedure: CARDIOVERSION;  Surgeon: Jerline Pain, MD;  [facility] END[facility]COPY;  Service: Cardiovascular;  Laterality: N/A;  . RIGHT/LEFT HEART CATH AND CORONARY ANGIOGRAPHY N/A 04/12/2019   Procedure: RIGHT/LEFT HEART CATH AND CORONARY ANGIOGRAPHY;  Surgeon: Jettie Booze, MD;  Location: [place] CV LAB;  Service: Cardiovascular;  Laterality: N/A;  .  TEE WITHOUT CARDIOVERSION N/A 04/13/2019   Procedure: TRANSESOPHAGEAL ECHOCARDIOGRAM (TEE);  Surgeon: Jake BatheSkains, Mark C, MD;  [facility];  Service: Cardiovascular;  Laterality: N/A;    Current Medications: Current Meds  Medication Sig  . atorvastatin (LIPITOR) 80 MG tablet Take 1 tablet (80 mg total) by  mouth daily at 6 PM.  . losartan (COZAAR) 25 MG tablet Take 1 tablet (25 mg total) by mouth daily.     Allergies:   Patient has no known allergies.   Social History   Socioeconomic History  . Marital status: Widowed    Spouse name: Not on file  . Number of children: Not on file  . Years of education: Not on file  . Highest education level: Not on file  Occupational History  . Not on file  Social Needs  . Financial resource strain: Not on file  . Food insecurity    Worry: Not on file    Inability: Not on file  . Transportation needs    Medical: Not on file    Non-medical: Not on file  Tobacco Use  . Smoking status: Former Smoker    Types: Cigarettes    Quit date: 04/11/1979    Years since quitting: 40.1  . Smokeless tobacco: Never Used  Substance and Sexual Activity  . Alcohol use: Never    Frequency: Never  . Drug use: Never  . Sexual activity: Not on file  Lifestyle  . Physical activity    Days per week: Not on file    Minutes per session: Not on file  . Stress: Not on file  Relationships  . Social Musicianconnections    Talks on phone: Not on file    Gets together: Not on file    Attends religious service: Not on file    Active member of club or organization: Not on file    Attends meetings of clubs or organizations: Not on file    Relationship status: Not on file  Other Topics Concern  . Not on file  Social History Narrative  . Not on file     Family History: The patient's family history includes Other in his father and mother.  ROS:   Please see the history of present illness.    All other systems reviewed and are negative.  EKGs/Labs/Other Studies Reviewed:    The following studies were reviewed today: RIGHT/LEFT HEART CATH AND CORONARY ANGIOGRAPHY  Conclusion    The left ventricular ejection fraction is 25-35% by visual estimate.  There is moderate to severe left ventricular systolic dysfunction.  LV end diastolic pressure is normal.  There is  no aortic valve stenosis.  Prox RCA to Mid RCA lesion is 25% stenosed.  RPDA lesion is 40% stenosed.  Ost Cx to Prox Cx lesion is 25% stenosed.  1st Mrg lesion is 90% stenosed.  Mid LAD lesion is 25% stenosed.  2nd Diag lesion is 40% stenosed.  Ao sat 97%, PA sat 63%, mean PA pressure 20 mm Hg; mean PCWP 11 mm Hg; CO 4.8 L/min; CI 2.11   LV dysfunction out of proportion to the degree of CAD.   Treat for heart failure.  If he has angina, could consider PCI of the OM.   Normal right heart pressures.     IMPRESSIONS    1. The left ventricle has severely reduced systolic function, with an ejection fraction of 20-25%. The cavity size was normal. Left ventricular diffuse hypokinesis.  2. The right ventricle has normal systolc function.  The cavity was mildly enlarged. There is no increase in right ventricular wall thickness.  3. Left atrial size was moderately dilated.  4. No evidence of a thrombus present in the left atrial appendage.  5. Right atrial size was moderately dilated.  6. The mitral valve is grossly normal. Mild thickening of the mitral valve leaflet. Mitral valve regurgitation is mild to moderate by color flow Doppler.  7. The aortic valve is tricuspid Mild thickening of the aortic valve Mild calcification of the aortic valve. Aortic valve regurgitation is trivial by color flow Doppler.   Recent Labs: 04/11/2019: ALT 31; B Natriuretic Peptide 116.6; TSH 3.731 04/13/2019: Magnesium 2.4 04/14/2019: BUN 23; Creatinine, Ser 1.37; Hemoglobin 11.1; Platelets 141; Potassium 3.8; Sodium 141  Recent Lipid Panel No results found for: CHOL, TRIG, HDL, CHOLHDL, VLDL, LDLCALC, LDLDIRECT  Physical Exam:    VS:  BP (!) 148/76   Pulse 81   Temp 97.7 F (36.5 C)   Ht 5\' 10"  (1.778 m)   Wt 242 lb 3.2 oz (109.9 kg)   SpO2 98%   BMI 34.75 kg/m     Wt Readings from Last 3 Encounters:  06/01/19 242 lb 3.2 oz (109.9 kg)  04/14/19 246 lb 1.6 oz (111.6 kg)     GEN: Patient is in  no acute distress HEENT: Normal NECK: No JVD; No carotid bruits LYMPHATICS: No lymphadenopathy CARDIAC: Hear sounds regular, 2/6 systolic murmur at the apex. RESPIRATORY:  Clear to auscultation without rales, wheezing or rhonchi  ABDOMEN: Soft, non-tender, non-distended MUSCULOSKELETAL:  No edema; No deformity  SKIN: Warm and dry NEUROLOGIC:  Alert and oriented x 3 PSYCHIATRIC:  Normal affect   Signed, Garwin Brothers, MD  06/01/2019 3:27 PM    [facility]

## 2019-06-01 NOTE — Patient Instructions (Signed)
Medication Instructions:  Your physician recommends that you continue on your current medications as directed. Please refer to the Current Medication list given to you today.  If you need a refill on your cardiac medications before your next appointment, please call your pharmacy.   Lab work: Your physician recommends that you return for lab work in: Bmet, CBC, TSH, LFT's, Lipids  If you have labs (blood work) drawn today and your tests are completely normal, you will receive your results only by: Marland Kitchen MyChart Message (if you have MyChart) OR . A paper copy in the mail If you have any lab test that is abnormal or we need to change your treatment, we will call you to review the results.  Testing/Procedures: Your physician has requested that you have an echocardiogram. Echocardiography is a painless test that uses sound waves to create images of your heart. It provides your doctor with information about the size and shape of your heart and how well your heart's chambers and valves are working. This procedure takes approximately one hour. There are no restrictions for this procedure.    Follow-Up: At [facility], you and your health needs are our priority.  As part of our continuing mission to provide you with exceptional heart care, we have created designated Provider Care Teams.  These Care Teams include your primary Cardiologist (physician) and Advanced Practice Providers (APPs -  Physician Assistants and Nurse Practitioners) who all work together to provide you with the care you need, when you need it. You will need a follow up appointment in 1 months.  Please call our office 2 months in advance to schedule this appointment.  You may see  or another member of our [employer] Provider Team in [place]: Jenne Campus, MD . Shirlee More, MD  Any Other Special Instructions Will Be Listed Below (If Applicable).

## 2019-06-02 NOTE — Addendum Note (Signed)
Addended by: Beckey Rutter on: 06/02/2019 07:40 AM   Modules accepted: Orders

## 2019-07-04 ENCOUNTER — Other Ambulatory Visit: Payer: Self-pay

## 2019-07-04 ENCOUNTER — Ambulatory Visit (INDEPENDENT_AMBULATORY_CARE_PROVIDER_SITE_OTHER): Payer: Medicare Other

## 2019-07-04 DIAGNOSIS — I428 Other cardiomyopathies: Secondary | ICD-10-CM

## 2019-07-04 DIAGNOSIS — I480 Paroxysmal atrial fibrillation: Secondary | ICD-10-CM | POA: Diagnosis not present

## 2019-07-04 NOTE — Progress Notes (Signed)
Complete echocardiogram has been performed.  Jimmy Kellene Mccleary RDCS, RVT 

## 2019-07-08 ENCOUNTER — Telehealth: Payer: Self-pay

## 2019-07-08 NOTE — Telephone Encounter (Signed)
-----   Message from Jenean Lindau, MD sent at 07/05/2019  1:14 PM EDT ----- Needs appointment to discuss these reports.  I will discuss in detail at next appointment. Cc  primary care/referring physician Jenean Lindau, MD 07/05/2019 1:14 PM

## 2019-07-08 NOTE — Telephone Encounter (Signed)
Results relayed, patient notified. Originally scheduled for f/u on 07/22/19 moved up to 07/15/19. No further questions at this time, copy sent to Dr. Jeryl Columbia per Dr. Docia Furl request.

## 2019-07-15 ENCOUNTER — Encounter: Payer: Self-pay | Admitting: Cardiology

## 2019-07-15 ENCOUNTER — Ambulatory Visit (INDEPENDENT_AMBULATORY_CARE_PROVIDER_SITE_OTHER): Payer: Medicare Other | Admitting: Cardiology

## 2019-07-15 VITALS — BP 160/80 | HR 86 | Ht 70.0 in | Wt 243.0 lb

## 2019-07-15 DIAGNOSIS — I10 Essential (primary) hypertension: Secondary | ICD-10-CM | POA: Diagnosis not present

## 2019-07-15 DIAGNOSIS — I25119 Atherosclerotic heart disease of native coronary artery with unspecified angina pectoris: Secondary | ICD-10-CM

## 2019-07-15 DIAGNOSIS — I480 Paroxysmal atrial fibrillation: Secondary | ICD-10-CM | POA: Diagnosis not present

## 2019-07-15 DIAGNOSIS — I428 Other cardiomyopathies: Secondary | ICD-10-CM

## 2019-07-15 DIAGNOSIS — I5043 Acute on chronic combined systolic (congestive) and diastolic (congestive) heart failure: Secondary | ICD-10-CM

## 2019-07-15 MED ORDER — ENTRESTO 24-26 MG PO TABS: 1.0000 | ORAL_TABLET | Freq: Two times a day (BID) | ORAL | 4 refills | Status: AC

## 2019-07-15 NOTE — Progress Notes (Signed)
Cardiology Office Note:    Date:  07/15/2019   ID:  Dwayne Mendoza, DOB [birth year], MRN [id number]  PCP:  Buckner Malta, MD  Cardiologist:  Garwin Brothers, MD   Referring MD: Buckner Malta, MD    ASSESSMENT:    No diagnosis found. PLAN:    In order of problems listed above:  1. Cardiomyopathy: I discussed my findings with the patient at extensive length.  In view of his ejection fraction I would like to stop his losartan.  I will asked him to stop it today.  Beginning Sunday he will start low-dose Entresto.  He will be back in 1 week for Chem-7.  He will keep a track of his blood pressures. 2. Paroxysmal atrial fibrillation:I discussed with the patient atrial fibrillation, disease process. Management and therapy including rate and rhythm control, anticoagulation benefits and potential risks were discussed extensively with the patient. Patient had multiple questions which were answered to patient's satisfaction. 3. Essential hypertension: Blood pressure is elevated and hopefully the above measures help him. 4. He will be seen in follow-up appointment in a month or earlier if he has any concerns.  He knows to go to the nearest emergency room for any concerning symptoms.   Medication Adjustments/Labs and Tests Ordered: Current medicines are reviewed at length with the patient today.  Concerns regarding medicines are outlined above.  No orders of the defined types were placed in this encounter.  No orders of the defined types were placed in this encounter.    Chief Complaint  Patient presents with  . Follow-up    ECHO     History of Present Illness:    Dwayne Mendoza is a 73 y.o. male.  Patient has past medical history of paroxysmal atrial fibrillation, moderately depressed systolic function and moderate aortic stenosis.  I reviewed the recent echocardiogram.  He denies any problems at this time and takes care of activities of daily living.  No chest pain orthopnea or  PND.  At the time of my evaluation, the patient is alert awake oriented and in no distress.  Past Medical History:  Diagnosis Date  . Acute on chronic combined systolic and diastolic CHF (congestive heart failure) ([facility]) 04/11/2019  . Arthritis   . Atrial fibrillation with RVR ([facility]) 04/11/2019  . Essential hypertension 04/11/2019    Past Surgical History:  Procedure Laterality Date  . CARDIOVERSION N/A 04/13/2019   Procedure: CARDIOVERSION;  Surgeon: Jake Bathe, MD;  Location[facility] ENDOSCOPY;  Service: Cardiovascular;  Laterality: N/A;  . RIGHT/LEFT HEART CATH AND CORONARY ANGIOGRAPHY N/A 04/12/2019   Procedure: RIGHT/LEFT HEART CATH AND CORONARY ANGIOGRAPHY;  Surgeon: Corky Crafts, MD;  Location: [facility] INVASIVE CV LAB;  Service: Cardiovascular;  Laterality: N/A;  . TEE WITHOUT CARDIOVERSION N/A 04/13/2019   Procedure: TRANSESOPHAGEAL ECHOCARDIOGRAM (TEE);  Surgeon: Jake Bathe, MD;  Location: [facility] ENDOSCOPY;  Service: Cardiovascular;  Laterality: N/A;    Current Medications: Current Meds  Medication Sig  . apixaban (ELIQUIS) 5 MG TABS tablet Take 1 tablet (5 mg total) by mouth 2 (two) times daily.  Marland Kitchen atorvastatin (LIPITOR) 80 MG tablet Take 1 tablet (80 mg total) by mouth daily at 6 PM.  . losartan (COZAAR) 25 MG tablet Take 1 tablet (25 mg total) by mouth daily.  . metoprolol tartrate (LOPRESSOR) 100 MG tablet Take 1 tablet (100 mg total) by mouth 2 (two) times daily.  Marland Kitchen spironolactone (ALDACTONE) 25 MG tablet Take 0.5 tablets (12.5 mg total) by mouth daily.  Allergies:   Patient has no known allergies.   Social History   Socioeconomic History  . Marital status: Widowed    Spouse name: Not on file  . Number of children: Not on file  . Years of education: Not on file  . Highest education level: Not on file  Occupational History  . Not on file  Social Needs  . Financial resource strain: Not on file  . Food insecurity    Worry: Not on file    Inability: Not on file  .  Transportation needs    Medical: Not on file    Non-medical: Not on file  Tobacco Use  . Smoking status: Former Smoker    Types: Cigarettes    Quit date: 04/11/1979    Years since quitting: 40.2  . Smokeless tobacco: Never Used  Substance and Sexual Activity  . Alcohol use: Never    Frequency: Never  . Drug use: Never  . Sexual activity: Not on file  Lifestyle  . Physical activity    Days per week: Not on file    Minutes per session: Not on file  . Stress: Not on file  Relationships  . Social Herbalist on phone: Not on file    Gets together: Not on file    Attends religious service: Not on file    Active member of club or organization: Not on file    Attends meetings of clubs or organizations: Not on file    Relationship status: Not on file  Other Topics Concern  . Not on file  Social History Narrative  . Not on file     Family History: The patient's family history includes Other in his father and mother.  ROS:   Please see the history of present illness.    All other systems reviewed and are negative.  EKGs/Labs/Other Studies Reviewed:    The following studies were reviewed today: IMPRESSIONS    1. Left ventricular ejection fraction, by visual estimation, is 30 to 35%. The left ventricle has moderately decreased function. Mildly increased left ventricular size.There is mildly increased left ventricular hypertrophy.  2. Left ventricular diastolic Doppler parameters are consistent with impaired relaxation pattern of LV diastolic filling (Grade I Diastolic Dysfunction).  3. Abnormal LV global longitudinal strain -5.5%.  4. Global right ventricle has normal systolic function.The right ventricular size is mildly enlarged.  5. There is moderate low gradient Aortic stenosis. Vmax 2.46m/s, Mean gradient 16 mmHG, AVA by VTI 1.30 cmsq, Dimensionless Index 0.37.  6. There is mild posterior mitral annular calcification. Mild mitral valve regurgitation. No evidence  of mitral stenosis.  7. Tricuspid valve regurgitation is trivial.  8. Normal pulmonary artery systolic pressure.   Recent Labs: 04/11/2019: ALT 31; B Natriuretic Peptide 116.6; TSH 3.731 04/13/2019: Magnesium 2.4 04/14/2019: BUN 23; Creatinine, Ser 1.37; Hemoglobin 11.1; Platelets 141; Potassium 3.8; Sodium 141  Recent Lipid Panel No results found for: CHOL, TRIG, HDL, CHOLHDL, VLDL, LDLCALC, LDLDIRECT  Physical Exam:    VS:  BP (!) 160/80 (BP Location: Left Arm, Patient Position: Sitting, Cuff Size: Normal)   Pulse 86   Ht 5\' 10"  (1.778 m)   Wt 243 lb (110.2 kg)   SpO2 96%   BMI 34.87 kg/m     Wt Readings from Last 3 Encounters:  07/15/19 243 lb (110.2 kg)  06/01/19 242 lb 3.2 oz (109.9 kg)  04/14/19 246 lb 1.6 oz (111.6 kg)     GEN: Patient is in no  acute distress HEENT: Normal NECK: No JVD; No carotid bruits LYMPHATICS: No lymphadenopathy CARDIAC: Hear sounds regular, 2/6 systolic murmur at the apex. RESPIRATORY:  Clear to auscultation without rales, wheezing or rhonchi  ABDOMEN: Soft, non-tender, non-distended MUSCULOSKELETAL:  No edema; No deformity  SKIN: Warm and dry NEUROLOGIC:  Alert and oriented x 3 PSYCHIATRIC:  Normal affect   Signed, Garwin Brothers, MD  07/15/2019 4:12 PM    [facility] and be established with my current practice.

## 2019-07-15 NOTE — Addendum Note (Signed)
Addended by: Beckey Rutter on: 07/15/2019 04:36 PM   Modules accepted: Orders

## 2019-07-15 NOTE — Patient Instructions (Addendum)
Medication Instructions:  Your physician has recommended you make the following change in your medication:  STOP taking losartan TODAY  START taking entresto 24-26 (1 tablet) twice daily starting on Sunday 07/17/19 If you need a refill on your cardiac medications before your next appointment, please call your pharmacy.   Lab work: Your physician recommends that you BMP to be drawn on 07/22/19  If you have labs (blood work) drawn today and your tests are completely normal, you will receive your results only by: Marland Kitchen. MyChart Message (if you have MyChart) OR . A paper copy in the mail If you have any lab test that is abnormal or we need to change your treatment, we will call you to review the results.  Testing/Procedures: NONE  Follow-Up: At [facility], you and your health needs are our priority.  As part of our continuing mission to provide you with exceptional heart care, we have created designated Provider Care Teams.  These Care Teams include your primary Cardiologist (physician) and Advanced Practice Providers (APPs -  Physician Assistants and Nurse Practitioners) who all work together to provide you with the care you need, when you need it. You will need a follow up appointment in 1 weeks.    Any Other Special Instructions Will Be Listed Below  Sacubitril; Valsartan oral tablet What is this medicine? SACUBITRIL; VALSARTAN (sak UE bi tril; val SAR tan) is a combination of 2 drugs used to reduce the risk of death and hospitalizations in people with long-lasting heart failure. It is usually used with other medicines to treat heart failure. This medicine may be used for other purposes; ask your health care provider or pharmacist if you have questions. COMMON BRAND NAME(S): Entresto What should I tell my health care provider before I take this medicine? They need to know if you have any of these conditions:  diabetes and take a medicine that contains aliskiren  kidney disease  liver  disease  an unusual or allergic reaction to sacubitril; valsartan, drugs called angiotensin converting enzyme (ACE) inhibitors, angiotensin II receptor blockers (ARBs), other medicines, foods, dyes, or preservatives  pregnant or trying to get pregnant  breast-feeding How should I use this medicine? Take this medicine by mouth with a glass of water. Follow the directions on the prescription label. You can take it with or without food. If it upsets your stomach, take it with food. Take your medicine at regular intervals. Do not take it more often than directed. Do not stop taking except on your doctor's advice. Do not take this medicine for at least 36 hours before or after you take an ACE inhibitor medicine. Talk to your health care provider if you are not sure if you take an ACE inhibitor. Talk to your pediatrician regarding the use of this medicine in children. Special care may be needed. Overdosage: If you think you have taken too much of this medicine contact a poison control center or emergency room at once. NOTE: This medicine is only for you. Do not share this medicine with others. What if I miss a dose? If you miss a dose, take it as soon as you can. If it is almost time for next dose, take only that dose. Do not take double or extra doses. What may interact with this medicine? Do not take this medicine with any of the following medicines:  aliskiren if you have diabetes  angiotensin-converting enzyme (ACE) inhibitors, like benazepril, captopril, enalapril, fosinopril, lisinopril, or ramipril This medicine may also interact  with the following medicines:  angiotensin II receptor blockers (ARBs) like azilsartan, candesartan, eprosartan, irbesartan, losartan, olmesartan, telmisartan, or valsartan  lithium  NSAIDS, medicines for pain and inflammation, like ibuprofen or naproxen  potassium-sparing diuretics like amiloride, spironolactone, and triamterene  potassium supplements This  list may not describe all possible interactions. Give your health care provider a list of all the medicines, herbs, non-prescription drugs, or dietary supplements you use. Also tell them if you smoke, drink alcohol, or use illegal drugs. Some items may interact with your medicine. What should I watch for while using this medicine? Tell your doctor or healthcare professional if your symptoms do not start to get better or if they get worse. Do not become pregnant while taking this medicine. Women should inform their doctor if they wish to become pregnant or think they might be pregnant. There is a potential for serious side effects to an unborn child. Talk to your health care professional or pharmacist for more information. You may get dizzy. Do not drive, use machinery, or do anything that needs mental alertness until you know how this medicine affects you. Do not stand or sit up quickly, especially if you are an older patient. This reduces the risk of dizzy or fainting spells. Avoid alcoholic drinks; they can make you more dizzy. What side effects may I notice from receiving this medicine? Side effects that you should report to your doctor or health care professional as soon as possible:  allergic reactions like skin rash, itching or hives, swelling of the face, lips, or tongue  signs and symptoms of increased potassium like muscle weakness; chest pain; or fast, irregular heartbeat  signs and symptoms of kidney injury like trouble passing urine or change in the amount of urine  signs and symptoms of low blood pressure like feeling dizzy or lightheaded, or if you develop extreme fatigue Side effects that usually do not require medical attention (report to your doctor or health care professional if they continue or are bothersome):  cough This list may not describe all possible side effects. Call your doctor for medical advice about side effects. You may report side effects to FDA at  1-800-FDA-1088. Where should I keep my medicine? Keep out of the reach of children. Store at room temperature between 15 and 30 degrees C (59 and 86 degrees F). Throw away any unused medicine after the expiration date. NOTE: This sheet is a summary. It may not cover all possible information. If you have questions about this medicine, talk to your doctor, pharmacist, or health care provider.  2020 Elsevier/Gold Standard (2015-11-14 13:54:19)  Food Basics for Chronic Kidney Disease When your kidneys are not working well, they cannot remove waste and excess substances from your blood as effectively as they did before. This can lead to a buildup and imbalance of these substances, which can worsen kidney damage and affect how your body functions. Certain foods lead to a buildup of these substances in the body. By changing your diet as recommended by your diet and nutrition specialist (dietitian) or health care provider, you could help prevent further kidney damage and delay or prevent the need for dialysis. What are tips for following this plan? General instructions   Work with your health care provider and dietitian to develop a meal plan that is right for you. Foods you can eat, limit, or avoid will be different for each person depending on the stage of kidney disease and any other existing health conditions.  Talk with your  health care provider about whether you should take a vitamin and mineral supplement.  Use standard measuring cups and spoons to measure servings of foods. Use a kitchen scale to measure portions of protein foods.  If directed by your health care provider, avoid drinking too much fluid. Measure and count all liquids, including water, ice, soups, flavored gelatin, and frozen desserts such as popsicles or ice cream. Reading food labels  Check the amount of sodium in foods. Choose foods that have less than 300 milligrams (mg) per serving.  Check the ingredient list for  phosphorus or potassium-based additives or preservatives.  Check the amount of saturated and trans fat. Limit or avoid these fats as told by your dietitian. Shopping  Avoid buying foods that are: ? Processed, frozen, or prepackaged. ? Calcium-enriched or fortified.  Do not buy foods that have salt or sodium listed among the first five ingredients.  Do not buy canned vegetables. Cooking  Replace animal proteins, such as meat, fish, eggs, or dairy, with plant proteins from beans, nuts, and soy. ? Use soy milk instead of cow's milk. ? Add beans or tofu to soups, casseroles, or pasta dishes instead of meat.  Soak vegetables, such as potatoes, before cooking to reduce potassium. To do this: ? Peel and cut into small pieces. ? Soak in warm water for at least 2 hours. For every 1 cup of vegetables, use 10 cups of water. ? Drain and rinse with warm water. ? Boil for at least 5 minutes. Meal planning  Limit the amount of protein from plant and animal sources you eat each day.  Do not add salt to food when cooking or before eating.  Eat meals and snacks at around the same time each day. If you have diabetes:  If you have diabetes (diabetes mellitus) and chronic kidney disease, it is important to keep your blood glucose in the target range recommended by your health care provider. Follow your diabetes management plan. This may include: ? Checking your blood glucose regularly. ? Taking oral medicines, insulin, or both. ? Exercising for at least 30 minutes on 5 or more days each week, or as told by your health care provider. ? Tracking how many servings of carbohydrates you eat at each meal.  You may be given specific guidelines on how much of certain foods and nutrients you may eat, depending on your stage of kidney disease and whether you have high blood pressure (hypertension). Follow your meal plan as told by your dietitian. What nutrients should be limited? The items listed are not a  complete list. Talk with your dietitian about what dietary choices are best for you. Potassium Potassium affects how steadily your heart beats. If too much potassium builds up in your blood, it can cause an irregular heartbeat or even a heart attack. You may need to eat less potassium, depending on your blood potassium levels and the stage of kidney disease. Talk to your dietitian about how much potassium you may have each day. You may need to limit or avoid foods that are high in potassium, such as:  Milk and soy milk.  Fruits, such as bananas, papaya, apricots, nectarines, melon, prunes, raisins, kiwi, and oranges.  Vegetables, such as potatoes, sweet potatoes, yams, tomatoes, leafy greens, beets, okra, avocado, pumpkin, and winter squash.  White and lima beans. Phosphorus Phosphorus is a mineral found in your bones. A balance between calcium and phosphorous is needed to build and maintain healthy bones. Too much phosphorus pulls calcium  from your bones. This can make your bones weak and more likely to break. Too much phosphorus can also make your skin itch. You may need to eat less phosphorus depending on your blood phosphorus levels and the stage of kidney disease. Talk to your dietitian about how much potassium you may have each day. You may need to take medicine to lower your blood phosphorus levels if diet changes do not help. You may need to limit or avoid foods that are high in phosphorus, such as:  Milk and dairy products.  Dried beans and peas.  Tofu, soy milk, and other soy-based meat replacements.  Colas.  Nuts and peanut butter.  Meat, poultry, and fish.  Bran cereals and oatmeals. Protein Protein helps you to make and keep muscle. It also helps in the repair of your body's cells and tissues. One of the natural breakdown products of protein is a waste product called urea. When your kidneys are not working properly, they cannot remove wastes, such as urea, like they did  before you developed chronic kidney disease. Reducing how much protein you eat can help prevent a buildup of urea in your blood. Depending on your stage of kidney disease, you may need to limit foods that are high in protein. Sources of animal protein include:  Meat (all types).  Fish and seafood.  Poultry.  Eggs.  Dairy. Other protein foods include:  Beans and legumes.  Nuts and nut butter.  Soy and tofu. Sodium Sodium, which is found in salt, helps maintain a healthy balance of fluids in your body. Too much sodium can increase your blood pressure and have a negative effect on the function of your heart and lungs. Too much sodium can also cause your body to retain too much fluid, making your kidneys work harder. Most people should have less than 2,300 milligrams (mg) of sodium each day. If you have hypertension, you may need to limit your sodium to 1,500 mg each day. Talk to your dietitian about how much sodium you may have each day. You may need to limit or avoid foods that are high in sodium, such as:  Salt seasonings.  Soy sauce.  Cured and processed meats.  Salted crackers and snack foods.  Fast food.  Canned soups and most canned foods.  Pickled foods.  Vegetable juice.  Boxed mixes or ready-to-eat boxed meals and side dishes.  Bottled dressings, sauces, and marinades. Summary  Chronic kidney disease can lead to a buildup and imbalance of waste and excess substances in the body. Certain foods lead to a buildup of these substances. By adjusting your intake of these foods, you could help prevent more kidney damage and delay or prevent the need for dialysis.  Food adjustments are different for each person with chronic kidney disease. Work with a dietitian to set up nutrient goals and a meal plan that is right for you.  If you have diabetes and chronic kidney disease, it is important to keep your blood glucose in the target range recommended by your health care  provider. This information is not intended to replace advice given to you by your health care provider. Make sure you discuss any questions you have with your health care provider. Document Released: 12/20/2002 Document Revised: 01/20/2019 Document Reviewed: 09/24/2016 Elsevier Patient Education  2020 [employer].

## 2019-07-22 ENCOUNTER — Ambulatory Visit: Payer: Medicare Other | Admitting: Cardiology

## 2019-08-14 DEATH — deceased

## 2019-08-17 ENCOUNTER — Ambulatory Visit: Payer: Medicare Other | Admitting: Cardiology

## 2020-07-21 IMAGING — DX CHEST  1 VIEW
2 series · 2 of 2 positions shown · non-contrast
Comparison: None.

CLINICAL DATA: 72-year-old male with shortness of breath.

EXAM:
CHEST  1 VIEW

[chest ap (1 of 2)]
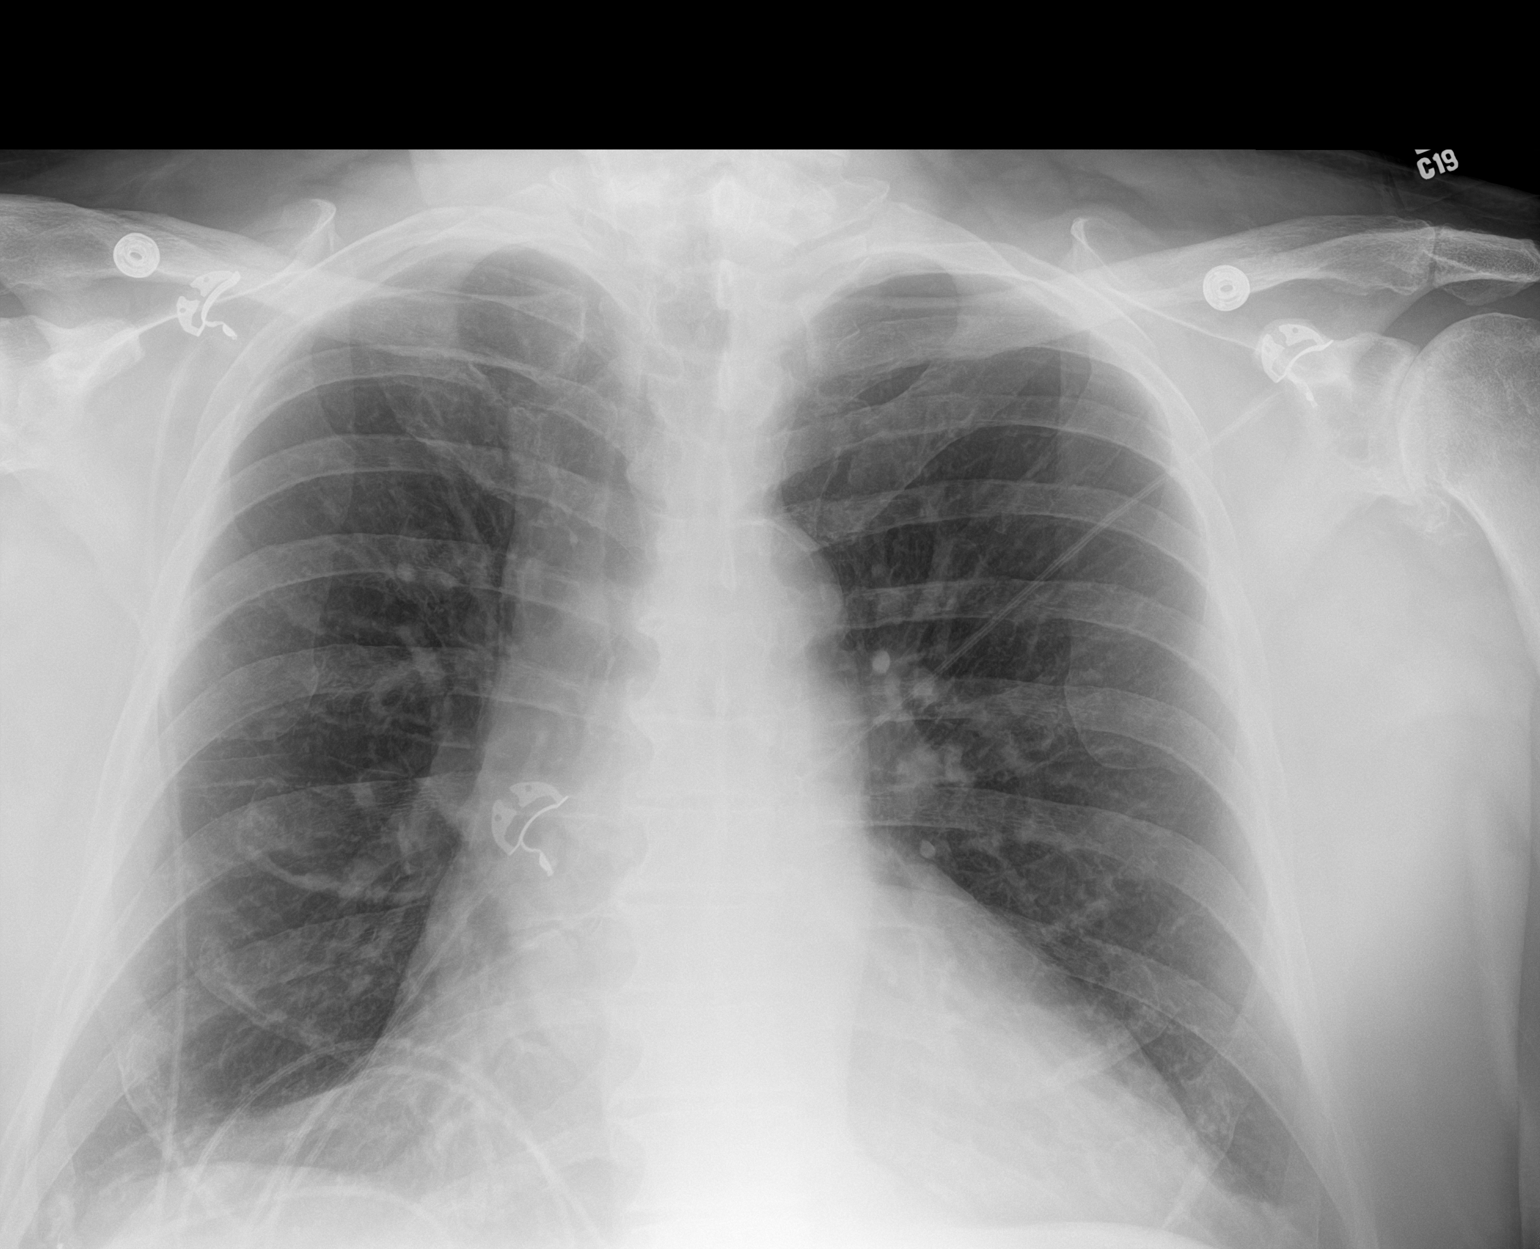

[chest ap (2 of 2)]
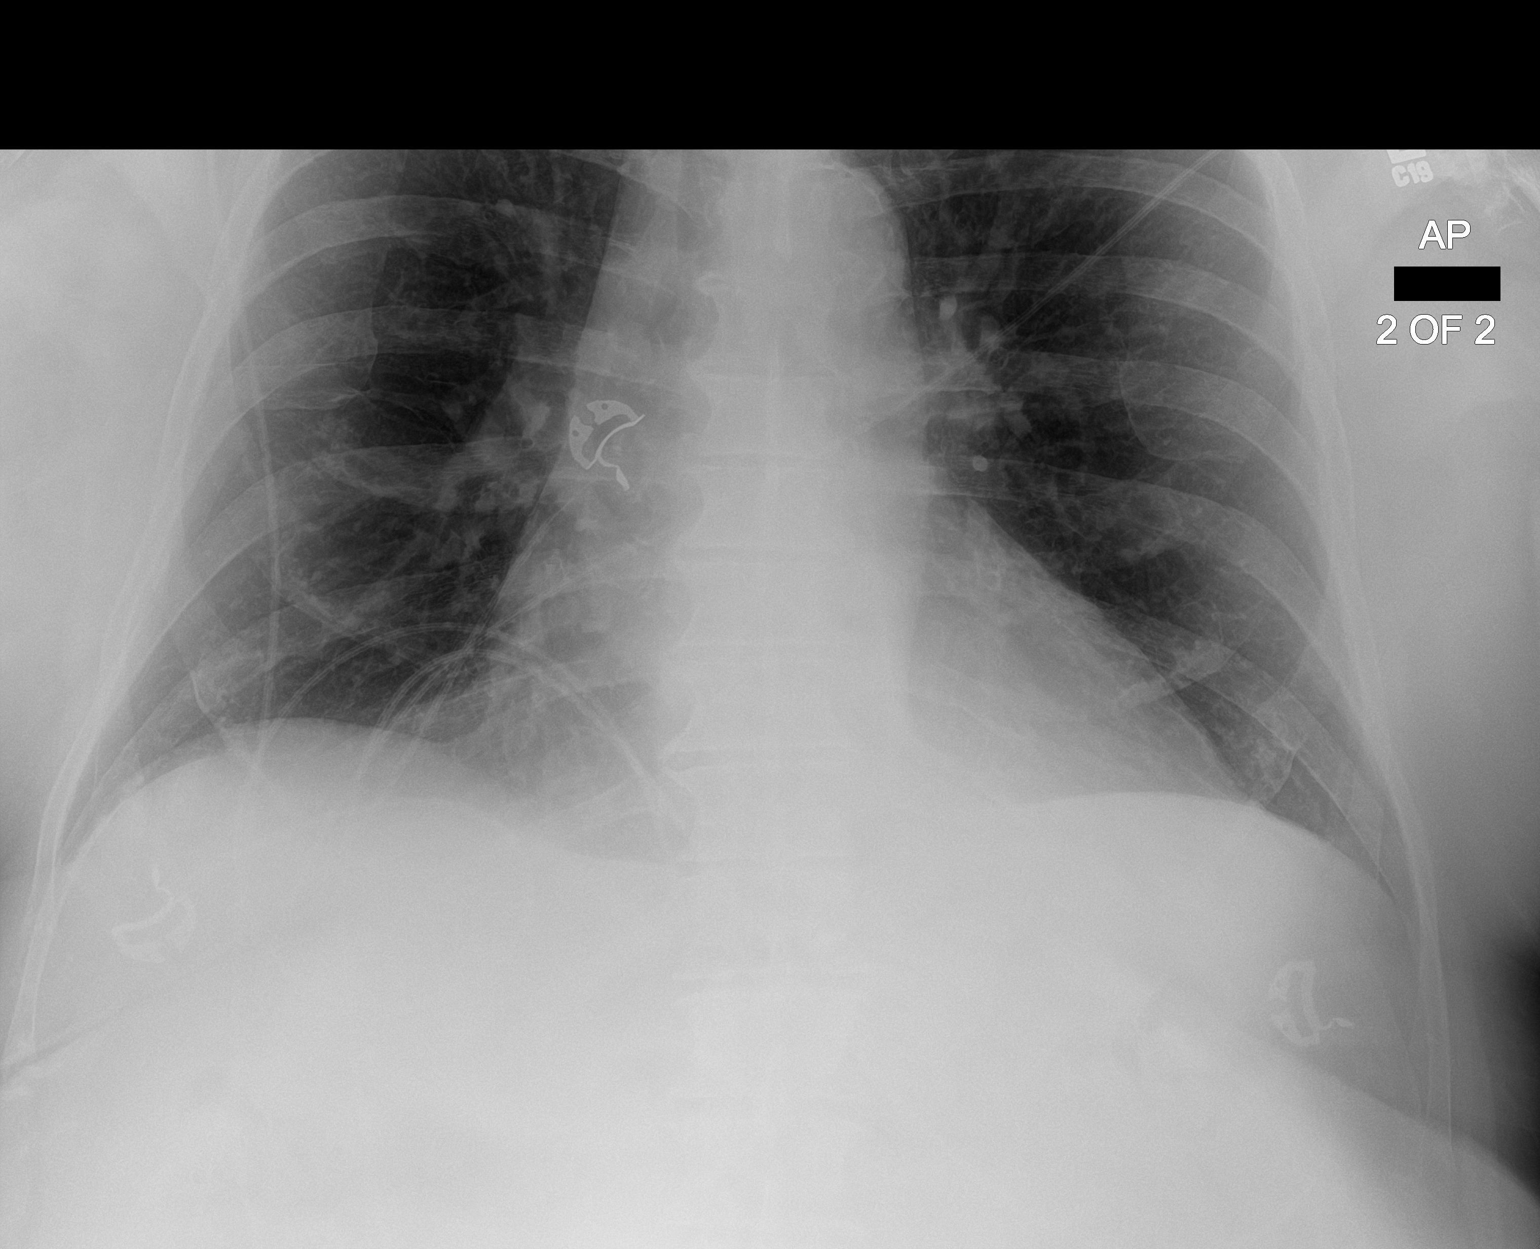

[2 of 2 positions shown; findings below may reference images not displayed]

FINDINGS: Portable AP semi upright view at 7220 hours. Borderline to mild
cardiomegaly. Other mediastinal contours are within normal limits.
Visualized tracheal air column is within normal limits. Allowing for
portable technique the lungs are clear. No pneumothorax or pleural
effusion.

Negative visible bowel gas pattern. Advanced degenerative changes at
the left shoulder.
IMPRESSION: No acute cardiopulmonary abnormality.
